# Patient Record
Sex: Female | Born: 1940 | Race: White | Hispanic: No | Marital: Married | Smoking: Former smoker
Health system: Southern US, Community
[De-identification: ages and names within clinical notes are randomized; demographics above are authoritative.]

## PROBLEM LIST (undated history)

## (undated) DIAGNOSIS — H269 Unspecified cataract: Secondary | ICD-10-CM

## (undated) DIAGNOSIS — Z8781 Personal history of (healed) traumatic fracture: Secondary | ICD-10-CM

## (undated) DIAGNOSIS — M1990 Unspecified osteoarthritis, unspecified site: Secondary | ICD-10-CM

## (undated) DIAGNOSIS — K219 Gastro-esophageal reflux disease without esophagitis: Secondary | ICD-10-CM

## (undated) DIAGNOSIS — R011 Cardiac murmur, unspecified: Secondary | ICD-10-CM

## (undated) HISTORY — DX: Unspecified cataract: H26.9

## (undated) HISTORY — DX: Cardiac murmur, unspecified: R01.1

## (undated) HISTORY — DX: Gastro-esophageal reflux disease without esophagitis: K21.9

## (undated) HISTORY — DX: Personal history of (healed) traumatic fracture: Z87.81

## (undated) HISTORY — DX: Unspecified osteoarthritis, unspecified site: M19.90

---

## 1977-04-04 HISTORY — PX: ABDOMINAL HYSTERECTOMY: SHX81

## 1998-05-27 ENCOUNTER — Other Ambulatory Visit: Admission: RE | Admit: 1998-05-27 | Discharge: 1998-05-27 | Payer: Self-pay | Admitting: Gynecology

## 1999-02-15 ENCOUNTER — Encounter: Payer: Self-pay | Admitting: Family Medicine

## 1999-02-15 ENCOUNTER — Encounter: Admission: RE | Admit: 1999-02-15 | Discharge: 1999-02-15 | Payer: Self-pay | Admitting: Family Medicine

## 1999-02-23 ENCOUNTER — Encounter: Payer: Self-pay | Admitting: Neurosurgery

## 1999-02-23 ENCOUNTER — Encounter: Admission: RE | Admit: 1999-02-23 | Discharge: 1999-02-23 | Payer: Self-pay | Admitting: Neurosurgery

## 1999-03-04 ENCOUNTER — Encounter: Payer: Self-pay | Admitting: Neurosurgery

## 1999-03-04 ENCOUNTER — Ambulatory Visit (HOSPITAL_COMMUNITY): Admission: RE | Admit: 1999-03-04 | Discharge: 1999-03-04 | Payer: Self-pay | Admitting: Neurosurgery

## 1999-03-25 ENCOUNTER — Encounter: Payer: Self-pay | Admitting: Neurosurgery

## 1999-03-25 ENCOUNTER — Ambulatory Visit (HOSPITAL_COMMUNITY): Admission: RE | Admit: 1999-03-25 | Discharge: 1999-03-25 | Payer: Self-pay | Admitting: Neurosurgery

## 1999-04-09 ENCOUNTER — Ambulatory Visit (HOSPITAL_COMMUNITY): Admission: RE | Admit: 1999-04-09 | Discharge: 1999-04-09 | Payer: Self-pay | Admitting: Neurosurgery

## 1999-04-09 ENCOUNTER — Encounter: Payer: Self-pay | Admitting: Neurosurgery

## 1999-06-07 ENCOUNTER — Other Ambulatory Visit: Admission: RE | Admit: 1999-06-07 | Discharge: 1999-06-07 | Payer: Self-pay | Admitting: Gynecology

## 2000-06-22 ENCOUNTER — Other Ambulatory Visit: Admission: RE | Admit: 2000-06-22 | Discharge: 2000-06-22 | Payer: Self-pay | Admitting: Gynecology

## 2001-06-13 ENCOUNTER — Other Ambulatory Visit: Admission: RE | Admit: 2001-06-13 | Discharge: 2001-06-13 | Payer: Self-pay | Admitting: Gynecology

## 2002-04-04 HISTORY — PX: CHOLECYSTECTOMY: SHX55

## 2002-06-12 ENCOUNTER — Other Ambulatory Visit: Admission: RE | Admit: 2002-06-12 | Discharge: 2002-06-12 | Payer: Self-pay | Admitting: Gynecology

## 2002-07-19 ENCOUNTER — Encounter (INDEPENDENT_AMBULATORY_CARE_PROVIDER_SITE_OTHER): Payer: Self-pay | Admitting: *Deleted

## 2002-07-19 ENCOUNTER — Encounter: Payer: Self-pay | Admitting: Emergency Medicine

## 2002-07-19 ENCOUNTER — Inpatient Hospital Stay (HOSPITAL_COMMUNITY): Admission: EM | Admit: 2002-07-19 | Discharge: 2002-07-22 | Payer: Self-pay | Admitting: Emergency Medicine

## 2002-07-19 ENCOUNTER — Encounter: Payer: Self-pay | Admitting: General Surgery

## 2002-07-20 ENCOUNTER — Encounter: Payer: Self-pay | Admitting: General Surgery

## 2002-07-20 ENCOUNTER — Encounter: Payer: Self-pay | Admitting: Gastroenterology

## 2002-09-11 ENCOUNTER — Ambulatory Visit (HOSPITAL_COMMUNITY): Admission: RE | Admit: 2002-09-11 | Discharge: 2002-09-11 | Payer: Self-pay | Admitting: Gastroenterology

## 2002-12-12 ENCOUNTER — Encounter: Payer: Self-pay | Admitting: Orthopedic Surgery

## 2002-12-12 ENCOUNTER — Ambulatory Visit (HOSPITAL_COMMUNITY): Admission: RE | Admit: 2002-12-12 | Discharge: 2002-12-12 | Payer: Self-pay | Admitting: Orthopedic Surgery

## 2002-12-25 ENCOUNTER — Ambulatory Visit (HOSPITAL_COMMUNITY): Admission: RE | Admit: 2002-12-25 | Discharge: 2002-12-25 | Payer: Self-pay | Admitting: Oncology

## 2002-12-25 ENCOUNTER — Encounter: Payer: Self-pay | Admitting: Oncology

## 2002-12-31 ENCOUNTER — Encounter: Payer: Self-pay | Admitting: Oncology

## 2002-12-31 ENCOUNTER — Ambulatory Visit (HOSPITAL_COMMUNITY): Admission: RE | Admit: 2002-12-31 | Discharge: 2002-12-31 | Payer: Self-pay | Admitting: Oncology

## 2003-01-03 ENCOUNTER — Encounter: Payer: Self-pay | Admitting: Oncology

## 2003-01-03 ENCOUNTER — Ambulatory Visit (HOSPITAL_COMMUNITY): Admission: RE | Admit: 2003-01-03 | Discharge: 2003-01-03 | Payer: Self-pay | Admitting: Oncology

## 2003-01-03 ENCOUNTER — Encounter (INDEPENDENT_AMBULATORY_CARE_PROVIDER_SITE_OTHER): Payer: Self-pay | Admitting: *Deleted

## 2003-01-03 ENCOUNTER — Encounter (INDEPENDENT_AMBULATORY_CARE_PROVIDER_SITE_OTHER): Payer: Self-pay | Admitting: Specialist

## 2003-01-31 ENCOUNTER — Ambulatory Visit (HOSPITAL_COMMUNITY): Admission: RE | Admit: 2003-01-31 | Discharge: 2003-01-31 | Payer: Self-pay | Admitting: Oncology

## 2003-02-10 ENCOUNTER — Encounter (INDEPENDENT_AMBULATORY_CARE_PROVIDER_SITE_OTHER): Payer: Self-pay | Admitting: *Deleted

## 2003-02-10 ENCOUNTER — Other Ambulatory Visit: Admission: RE | Admit: 2003-02-10 | Discharge: 2003-02-10 | Payer: Self-pay | Admitting: Oncology

## 2003-04-05 HISTORY — PX: COLONOSCOPY: SHX174

## 2003-04-14 ENCOUNTER — Ambulatory Visit (HOSPITAL_COMMUNITY): Admission: RE | Admit: 2003-04-14 | Discharge: 2003-04-14 | Payer: Self-pay | Admitting: Oncology

## 2003-07-01 ENCOUNTER — Other Ambulatory Visit: Admission: RE | Admit: 2003-07-01 | Discharge: 2003-07-01 | Payer: Self-pay | Admitting: Gynecology

## 2003-08-25 ENCOUNTER — Ambulatory Visit (HOSPITAL_COMMUNITY): Admission: RE | Admit: 2003-08-25 | Discharge: 2003-08-25 | Payer: Self-pay | Admitting: Oncology

## 2003-09-08 ENCOUNTER — Ambulatory Visit (HOSPITAL_COMMUNITY): Admission: RE | Admit: 2003-09-08 | Discharge: 2003-09-08 | Payer: Self-pay | Admitting: Oncology

## 2003-10-27 ENCOUNTER — Ambulatory Visit (HOSPITAL_COMMUNITY): Admission: RE | Admit: 2003-10-27 | Discharge: 2003-10-27 | Payer: Self-pay | Admitting: Gastroenterology

## 2003-10-27 ENCOUNTER — Encounter (INDEPENDENT_AMBULATORY_CARE_PROVIDER_SITE_OTHER): Payer: Self-pay | Admitting: *Deleted

## 2004-02-04 ENCOUNTER — Encounter: Admission: RE | Admit: 2004-02-04 | Discharge: 2004-02-04 | Payer: Self-pay | Admitting: General Surgery

## 2004-06-21 ENCOUNTER — Other Ambulatory Visit: Admission: RE | Admit: 2004-06-21 | Discharge: 2004-06-21 | Payer: Self-pay | Admitting: Gynecology

## 2004-07-07 ENCOUNTER — Encounter: Admission: RE | Admit: 2004-07-07 | Discharge: 2004-07-07 | Payer: Self-pay | Admitting: Gynecology

## 2005-07-08 ENCOUNTER — Encounter: Admission: RE | Admit: 2005-07-08 | Discharge: 2005-07-08 | Payer: Self-pay | Admitting: Internal Medicine

## 2005-07-20 ENCOUNTER — Other Ambulatory Visit: Admission: RE | Admit: 2005-07-20 | Discharge: 2005-07-20 | Payer: Self-pay | Admitting: Gynecology

## 2006-07-10 ENCOUNTER — Encounter: Admission: RE | Admit: 2006-07-10 | Discharge: 2006-07-10 | Payer: Self-pay | Admitting: Unknown Physician Specialty

## 2006-07-18 ENCOUNTER — Other Ambulatory Visit: Admission: RE | Admit: 2006-07-18 | Discharge: 2006-07-18 | Payer: Self-pay | Admitting: Gynecology

## 2009-04-04 DIAGNOSIS — Z8781 Personal history of (healed) traumatic fracture: Secondary | ICD-10-CM

## 2009-04-04 HISTORY — DX: Personal history of (healed) traumatic fracture: Z87.81

## 2010-02-19 ENCOUNTER — Inpatient Hospital Stay (HOSPITAL_COMMUNITY)
Admission: EM | Admit: 2010-02-19 | Discharge: 2010-02-23 | Payer: Self-pay | Source: Home / Self Care | Admitting: Emergency Medicine

## 2010-06-16 LAB — DIFFERENTIAL
Basophils Absolute: 0 10*3/uL (ref 0.0–0.1)
Basophils Relative: 0 % (ref 0–1)
Eosinophils Absolute: 0 10*3/uL (ref 0.0–0.7)
Eosinophils Relative: 0 % (ref 0–5)
Lymphocytes Relative: 4 % — ABNORMAL LOW (ref 12–46)
Lymphs Abs: 0.6 10*3/uL — ABNORMAL LOW (ref 0.7–4.0)
Monocytes Absolute: 0.9 10*3/uL (ref 0.1–1.0)
Monocytes Relative: 6 % (ref 3–12)
Neutro Abs: 13 10*3/uL — ABNORMAL HIGH (ref 1.7–7.7)
Neutrophils Relative %: 90 % — ABNORMAL HIGH (ref 43–77)

## 2010-06-16 LAB — CBC
HCT: 40.8 % (ref 36.0–46.0)
Hemoglobin: 13.7 g/dL (ref 12.0–15.0)
MCH: 30.9 pg (ref 26.0–34.0)
MCHC: 33.6 g/dL (ref 30.0–36.0)
MCV: 92.1 fL (ref 78.0–100.0)
Platelets: 185 10*3/uL (ref 150–400)
RBC: 4.43 MIL/uL (ref 3.87–5.11)
RDW: 13 % (ref 11.5–15.5)
WBC: 14.5 10*3/uL — ABNORMAL HIGH (ref 4.0–10.5)

## 2010-06-16 LAB — BASIC METABOLIC PANEL
BUN: 7 mg/dL (ref 6–23)
CO2: 28 mEq/L (ref 19–32)
Calcium: 9.1 mg/dL (ref 8.4–10.5)
Chloride: 105 mEq/L (ref 96–112)
Creatinine, Ser: 0.68 mg/dL (ref 0.4–1.2)
GFR calc Af Amer: >60 mL/min (ref >60)
GFR calc non Af Amer: >60 mL/min (ref >60)
Glucose, Bld: 142 mg/dL — ABNORMAL HIGH (ref 70–99)
Potassium: 3.9 mEq/L (ref 3.5–5.1)
Sodium: 140 mEq/L (ref 135–145)

## 2010-08-20 NOTE — Op Note (Signed)
NAME:  Ashlee Hamilton, Ashlee Hamilton                        ACCOUNT NO.:  [id number]   MEDICAL RECORD NO.:  [id number]                   PATIENT TYPE:  AMB   LOCATION:  ENDO                                 FACILITY:  [facility]   PHYSICIAN:  Anselmo Rod, M.D.               DATE OF BIRTH:  16-Jul-1940   DATE OF PROCEDURE:  10/27/2003  DATE OF DISCHARGE:                                 OPERATIVE REPORT   PROCEDURE PERFORMED:  Colonoscopy with snare polypectomy x1 and cold  biopsies x3.   ENDOSCOPIST:  Anselmo Rod, M.D.   INSTRUMENT USED:  Olympus video colonoscope.   INDICATION FOR PROCEDURE:  A 70 year old white female undergoing screening  colonoscopy.  Rule out colonic polyps, masses, etc.   PREPROCEDURE PREPARATION:  Informed consent was procured from the patient.  The patient was fasted for eight hours prior to the procedure and prepped  with a bottle of magnesium citrate and a gallon of GoLYTELY the night prior  to the procedure.   PREPROCEDURE PHYSICAL:  VITAL SIGNS:  The patient had stable vital signs.  NECK:  Supple.  CHEST:  Clear to auscultation.  S1, S2 regular.  ABDOMEN:  Soft with normal bowel sounds.   DESCRIPTION OF PROCEDURE:  The patient was placed in the left lateral  decubitus position and sedated with 80 mg of Demerol and 8 mg of Versed in  slow incremental doses.  Once the patient was adequately sedate and  maintained on low-flow oxygen and continuous cardiac monitoring, the Olympus  video colonoscope was advanced from the rectum to the cecum.  The  appendiceal orifice and the ileocecal valve were visualized and  photographed.  A small sessile polyp was snared from the rectum and three  small sessile polyps were biopsied from the rectum as well as small internal  hemorrhoids were seen on retroflexion.  One external hemorrhoids was seen on  anal inspection.  There was some residual stool in the colon.  Multiple  washes were done.   IMPRESSION:  1. Four polyps  removed from rectum, one by snare polypectomy and three by     cold biopsies.  2. Small external hemorrhoids.  3. Small internal hemorrhoids.  4. No evidence of diverticulosis.   RECOMMENDATIONS:  1. Await pathology results.  2. Avoid nonsteroidals, including aspirin, for now.  3. Outpatient follow-up in the next two weeks for further recommendations.                                               Anselmo Rod, M.D.    JNM/MEDQ  D:  10/27/2003  T:  10/27/2003  Job:  [id number]   cc:   Barry Dienes. Eloise Harman, M.D.  [address]  [place]  [place] [address]  Fax: [phone]

## 2010-08-20 NOTE — Discharge Summary (Signed)
   Ashlee Hamilton, Ashlee Hamilton                    ACCOUNT NO.:  [id number]   MEDICAL RECORD NO.:  [id number]                   PATIENT TYPE:  INP   LOCATION:  5016                                 FACILITY:  [facility]   PHYSICIAN:  Adolph Pollack, M.D.            DATE OF BIRTH:  1940-12-28   DATE OF ADMISSION:  07/19/2002  DATE OF DISCHARGE:  07/22/2002                                 DISCHARGE SUMMARY   PRINCIPAL DISCHARGE DIAGNOSIS:  1. Acute cholecystitis and cholelithiasis.  2. Status post cholecystectomy for bile leak from accessory duct of Luschka.  3. Bronchitis.   PROCEDURE:  1. Laparoscopic cholecystectomy with intraoperative cholangiogram July 19, 2002.  2. ERCP with stent placement July 20, 2002.   REASON FOR ADMISSION:  [name] is a 70 year old female admitted with  right upper pain and tenderness. She had some significant right upper  quadrant tenderness on examination. Her white blood cell count had a  leftward shift. Common bile duct was 7 to 8 mm. She subsequently was  admitted and taken to the operating room.   HOSPITAL COURSE:  She was admitted and underwent the above procedure and did  well until the morning of her first postoperative day where early on she  began having acute onset of sharp right flank pain and back pain. A stat  HIDA scan was performed, and empiric IV Unasyn was given. This showed  evidence of a bile leakage and underwent ERCP and immediately felt much  better.   She was also noted to have a left upper nodule on chest x-ray. She stated  that she had old x-rays, so we were going to retrieve these as an outpatient  and review that. By July 22, 2002, she was comfortable, tolerating a diet,  afebrile, feeling well, and she was subsequently discharged.    DISPOSITION:  Discharged home in satisfactory condition on July 22, 2002.  She will return to see Dr. Madilyn Fireman for stent removal. She will follow up with  me in the office in about two  weeks. Instruction sheet has been given; it  also has activity restrictions for her. She is discharged in satisfactory  condition.                                               Adolph Pollack, M.D.    Kari Baars  D:  09/23/2002  T:  09/24/2002  Job:  [id number]   cc:   Everardo All. Madilyn Fireman, M.D.  [address], [address]  [place]  [place] [address]  Fax: [phone]    cc:   Everardo All. Madilyn Fireman, M.D.  [address], [address]  [place]  [place] [address]  Fax: [phone]

## 2010-08-20 NOTE — Op Note (Signed)
NAME:  Ashlee Hamilton, Ashlee Hamilton                    ACCOUNT NO.:  [id number]   MEDICAL RECORD NO.:  [id number]                   PATIENT TYPE:  INP   LOCATION:  1823                                 FACILITY:  [facility]   PHYSICIAN:  Adolph Pollack, M.D.            DATE OF BIRTH:  [birth year]   DATE OF PROCEDURE:  07/19/2002  DATE OF DISCHARGE:                                 OPERATIVE REPORT   PREOPERATIVE DIAGNOSIS:  Acute cholecystitis.   POSTOPERATIVE DIAGNOSIS:  Acute cholecystitis.   PROCEDURE:  Laparoscopic cholecystectomy with intraoperative cholangiogram.   SURGEON:  Adolph Pollack, M.D.   ASSISTANT:  Magnus Ivan, R.N.F.A.   ANESTHESIA:  General.   INDICATIONS:  The patient is a 70 year old female with persistent epigastric  abdominal pain, tenderness, and elevation of white blood cell count, also  has ultrasound findings consistent with acute cholecystitis.  Is now brought  to the operating room for urgent cholecystectomy.  The procedure and the  risks were explained to her preoperatively.   DESCRIPTION OF PROCEDURE:  She was seen in the holding area and brought to  the operating room, placed supine on the operating table, and a general  anesthetic was administered.  Her abdomen was sterilely prepped and draped.  Marcaine 0.5% was infiltrated in the subumbilical region and a small  subumbilical incision made, incising the skin and subcutaneous tissue and  midline fascia.  The peritoneal cavity was then entered bluntly and under  direct vision.  A pursestring suture of 0 Vicryl was placed around the  fascial edges.  A Hasson trocar was introduced into the peritoneal cavity  and pneumoperitoneum created by insufflation of CO2 gas.  Next the  laparoscope was introduced.  No underlying visceral injury was anted  directly below the trocar site.   She was then placed in the reversed Trendelenburg position with the right  side slightly tilted up.  An 11 mm incision was  made through an epigastric  incision and two 5 mm incisions made in the right midabdomen.  The  gallbladder was visualized and was acutely inflamed and slightly distended.  The fundus of the gallbladder was able to be grasped and retracted toward  the right shoulder.  There were stones impacted in the gallbladder neck.  The area above the neck was grasped and retracted laterally.  Using careful  blunt dissection and select cautery, the infundibulum was mobilized.  The  cystic duct was identified and a window created around it.  A clip was  placed just above the cystic duct-gallbladder junction and a small incision  made at the cystic duct-gallbladder junction.  A cholangiocatheter was  passed through the anterior wall and placed into the cystic duct and a  cholangiogram was performed.   Under real-time fluoroscopy and using dilute contrast material,  cholangiography was performed.  The contrast was injected into the cystic  duct.  The common hepatic, right and left hepatic, and common bile ducts  all  filled.  The common bile duct drained into the duodenum.  I did not see any  obvious evidence of obstruction.  The final report is pending the  radiologist's interpretation.   The cholangiocatheter was removed.  The cystic duct was then clipped three  times proximally and divided.  Using careful blunt dissection, I dissected  the node of Calot free from an anterior branch of the cystic artery.  I  created a window around this anterior branch, clipped it and divided it.  The posterior branch was also identified and a window created around it and  clipped and divided close to the gallbladder.  The gallbladder was then  dissected free from the liver bed using electrocautery and placed in an  Endopouch bag.   I then copiously irrigated out the gallbladder fossa.  I noted no bleeding  or bile leakage.  I then removed the gallbladder in the Endopouch bag  through the subumbilical incision and  closed the defect under laparoscopic  vision by tightening up and tying down the pursestring suture.  I  reinspected the gallbladder fossa and again noted no bleeding or leakage of  bile.  I removed the trocars and released the pneumoperitoneum after  evacuating as much of the irrigation fluid as possible.  I then closed the  skin incisions with 4-0 Monocryl subcuticular stitches.  Steri-Strips and  then sterile dressings were applied.   She tolerated the procedure well without any apparent complications and was  taken to the recovery room in satisfactory condition.                                               Adolph Pollack, M.D.    Kari Baars  D:  07/19/2002  T:  07/20/2002  Job:  [id number]   cc:   Leatha Gilding. Mezer, M.D.  [address]  [place]  [place] [address]  Fax: [phone]

## 2010-08-20 NOTE — Op Note (Signed)
   NAME:  Ashlee Hamilton, Ashlee Hamilton                    ACCOUNT NO.:  [id number]   MEDICAL RECORD NO.:  [id number]                   PATIENT TYPE:  AMB   LOCATION:  ENDO                                 [facility]ACILITY:  [facility]   PHYSICIAN:  John C. Madilyn Fireman, M.D.                 DATE OF BIRTH:  03-01-1941   DATE OF PROCEDURE:  09/11/2002  DATE OF DISCHARGE:                                 OPERATIVE REPORT   PROCEDURE:  Esophagogastroduodenoscopy with stent removal.   INDICATIONS FOR PROCEDURE:  A patient with a previously placed biliary stent  due to a bile leak identified after laparoscopic cholecystectomy. The  patient has improved clinically with normalization of liver function tests,  resolution of pain and procedure is requested to remove the stent.   DESCRIPTION OF PROCEDURE:  The patient was placed in the left lateral  decubitus position then placed on the pulse monitor with continuous low flow  oxygen delivered by nasal cannula. She was sedated with 75 mcg IV fentanyl  and 6 mg IV Versed. The Olympus side viewing endoscope was advanced blindly  into the oropharynx, esophagus, and stomach. The stomach appeared grossly  normal with no abnormalities and the pylorus was identified and traversed  with the levator located on the medial duodenal wall. The previously placed  stent appeared to be in good position and was draining bile. It was grasped  with the snare loop and pulled out through the mouth with the scope. The  patient was then returned to the recovery room in stable condition. She  tolerated the procedure well and there were no immediate complications.   IMPRESSION:  Replacement of previously placed biliary stent endoscopically.                                               John C. Madilyn Fireman, M.D.    JCH/MEDQ  D:  09/11/2002  T:  09/11/2002  Job:  [id number]   cc:   Adolph Pollack, M.D.  [address], [address]  [place]  [place] [address]  Fax: [phone]

## 2010-08-20 NOTE — Op Note (Signed)
NAME:  Ashlee Hamilton, Ashlee Hamilton                    ACCOUNT NO.:  [id number]   MEDICAL RECORD NO.:  [id number]                   PATIENT TYPE:  INP   LOCATION:  5016                                 FACILITY:  [facility]   PHYSICIAN:  John C. Madilyn Fireman, M.D.                 DATE OF BIRTH:  01-13-1941   DATE OF PROCEDURE:  07/20/2002  DATE OF DISCHARGE:                                 OPERATIVE REPORT   PROCEDURE:  Endoscopic retrograde cholangiopancreatography with stent  placement.   INDICATIONS:  The patient is status post laparoscopic cholecystectomy  yesterday with abrupt onset of abdominal pain this morning and PIPIDA scan  showing bile leak.   DESCRIPTION OF PROCEDURE:  The patient was placed in the prone position and  placed on the pulse monitor with continuous low flow oxygen delivered by  nasal cannula.  Informed consent was obtained prior to the procedure.  Risks  including a 5% chance of pancreatitis were explained to the patient.  She  wished to proceed.  The Olympus video side-viewing endoscope was advanced blindly into the lower  pharynx, esophagus and stomach.  There was a mild amount of bilious fluid in  the stomach that was suctioned.  Otherwise, the fundus, body, antrum, and  pylorus all appeared normal.  The duodenum was entered and the papilla of  Vater located on the medial duodenal wall.  It had a fairly normal  appearance and was cannulated with shallow cannulation with the Wilson-Cook  sphincterotome and with passage of the Jag wire, we were able to achieve  selective cannulation of the common bile duct.  A cholangiogram was  obtained, which identified the cystic duct and some intrahepatic bile duct  as well as the common bile and common hepatic duct.  No obvious filling  defects were seen and no obvious extravasation was seen with two syringefuls  of contrast injected.  Based on a positive PIPIDA scan, I elected to go  ahead and proceed with placement of biliary stent  using the Oasis system.  A  5 cm 10-French plastic stent was placed through the papilla with good final  position and good drainage.  The scope was then withdrawn and the patient  returned to the recovery room in stable condition.  She tolerated the  procedure well and there were no immediate complications.   IMPRESSION:  No biliary leak identified by current cholangiogram with leak  inferred from PIPIDA scan, status post biliary stent placement.   PLAN:  Will follow clinically for resolution of symptoms and leave stent in  for at least a month or two.                                               John C. Madilyn Fireman, M.D.    JCH/MEDQ  D:  07/20/2002  T:  07/21/2002  Job:  [id number]   cc:   Adolph Pollack, M.D.  [address]  [place]  [place] [address]  Fax: [phone]

## 2010-08-20 NOTE — H&P (Signed)
NAME:  Ashlee Hamilton, Ashlee Hamilton                    ACCOUNT NO.:  [id number]   MEDICAL RECORD NO.:  [id number]                   PATIENT TYPE:  EMS   LOCATION:  MAJO                                 FACILITY:  [facility]   PHYSICIAN:  Adolph Pollack, M.D.            DATE OF BIRTH:  [birth year]   DATE OF ADMISSION:  07/19/2002  DATE OF DISCHARGE:                                HISTORY & PHYSICAL   CHIEF COMPLAINT:  Epigastric abdominal pain.   HISTORY OF PRESENT ILLNESS:  The patient is a 70 year old female who ate a  steak and cheese sandwich last night and had a fairly significant attack of  sharp epigastric pain that radiated through the back, associated with nausea  and vomiting.  The pain has continued without relief.  She presented to the  emergency department for evaluation.  No fever or chills.  No diarrhea.  She  has had other episodes like this but none as severe and most of them have  been self-limited.  Nothing that she has tried has been able to relieve it.   PAST MEDICAL HISTORY:  1. Herniated lumbar disc.  2. Hormonal deficiency.   PAST SURGICAL HISTORY:  Partial hysterectomy.   ALLERGIES:  No known drug allergies.   MEDICATION:  Premarin.   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  She smokes a pack  of cigarettes a day and has been doing so for some 28 years.  There is only  occasional alcohol use.  No drug abuse.   FAMILY HISTORY:  Father died of prostate cancer.  Brother died from  metastatic cancer, type unknown to her.  She does have brother and sister  with hypertension and hypercholesterolemia.   REVIEW OF SYSTEMS:  She states she has had about a 15 pound weight loss over  the past year although she has not had a change in appetite or food intake.  She has just attributed it to the fact that she had been doing a lot of  painting and it was hot last summer.  No change in her energy level.  CARDIOVASCULAR:  She reports having a heart murmur in  the past.  No known  coronary artery disease, no hypertension.  PULMONARY:  She does get  bronchitis intermittently.  No pneumonia or asthma.  No known COPD.  GI:  No  peptic ulcer disease, hepatitis, diverticulitis.  She does have some  intermittent constipation.  GU:  No kidney stones. ENDOCRINE:  No thyroid  disease or diabetes.  NEUROLOGIC:  No strokes or seizures.  HEMATOLOGIC:  No  bleeding disorders, transfusions or venous thrombosis.   PHYSICAL EXAMINATION:  GENERAL APPEARANCE:  She is a thin female in no acute  distress, fairly pleasant and cooperative.  She was much more comfortable  after a Dilaudid shot.  HEENT:  Extraocular movements intact.  No icterus noted.  NECK:  Supple without palpable masses or obvious thyroid enlargement.  RESPIRATORY:  Breath sounds are equal with a soft wheeze bilaterally.  Respirations are not labored.  CARDIOVASCULAR:  Heart demonstrates regular rate and rhythm and I do not  appreciate a murmur.  ABDOMEN:  Soft with right upper quadrant epigastric tenderness and guarding  to palpation and percussion.  No mass noted.  No hernias noted.  RECTAL:  Normal tone, no masses, stool is dark brown and has been sent for  occult blood testing.  EXTREMITIES:  Full range of motion, no cyanosis or edema.  NEUROLOGIC:  She is alert and oriented and has 5/5 motor strength.   LABORATORY DATA:  Her liver function tests are all within normal limits.  Potassium is 3.4, sodium 133, glucose 120.  Lipase  26.  White blood cell  count 10,900 with leftward sift, hemoglobin 15.2, platelet count 151,000.   Abdominal ultrasound demonstrates cholelithiasis with gallstones fixed at  the gallbladder neck.  There is a sonographic Murphy's sign noted.  Common  bile duct was 7 to 8 mm, thought to be slightly dilated.   IMPRESSION:  1. Acute cholecystitis by history and examination as well as ultrasound.  2. Mild hypokalemia and hyponatremia.  3. A 15 pound weight loss over a  year, reason unknown.   PLAN:  Admission to hospital for IV antibiotics and laparoscopic, possible  open, cholecystectomy.  I discussed the procedure rational and risks with  her.  The risks include, but are not limited to, bleeding, infection, common  bile duct injury, hepatic injury or bile leak, small intestinal injury,  risks of general anesthesia, and post cholecystectomy diarrhea.  We also  talked about the fact that she did have common bile duct stones and she may  need ERCP.  Both she and her husband seem to understand and agree to  proceed.                                               Adolph Pollack, M.D.    Kari Baars  D:  07/19/2002  T:  07/19/2002  Job:  [id number]

## 2011-05-30 ENCOUNTER — Encounter: Payer: Self-pay | Admitting: Family Medicine

## 2011-05-30 NOTE — Telephone Encounter (Signed)
Error

## 2011-06-29 NOTE — Telephone Encounter (Signed)
This encounter was created in error - please disregard.

## 2012-07-18 ENCOUNTER — Encounter: Payer: Self-pay | Admitting: Internal Medicine

## 2012-07-18 ENCOUNTER — Other Ambulatory Visit (INDEPENDENT_AMBULATORY_CARE_PROVIDER_SITE_OTHER): Payer: Medicare Other

## 2012-07-18 ENCOUNTER — Ambulatory Visit (INDEPENDENT_AMBULATORY_CARE_PROVIDER_SITE_OTHER): Payer: Medicare Other | Admitting: Internal Medicine

## 2012-07-18 VITALS — BP 142/78 | HR 55 | Temp 97.9°F | Ht 68.0 in | Wt 192.5 lb

## 2012-07-18 DIAGNOSIS — M8580 Other specified disorders of bone density and structure, unspecified site: Secondary | ICD-10-CM

## 2012-07-18 DIAGNOSIS — Z23 Encounter for immunization: Secondary | ICD-10-CM

## 2012-07-18 DIAGNOSIS — M899 Disorder of bone, unspecified: Secondary | ICD-10-CM

## 2012-07-18 DIAGNOSIS — R030 Elevated blood-pressure reading, without diagnosis of hypertension: Secondary | ICD-10-CM

## 2012-07-18 DIAGNOSIS — Z136 Encounter for screening for cardiovascular disorders: Secondary | ICD-10-CM

## 2012-07-18 DIAGNOSIS — Z13220 Encounter for screening for lipoid disorders: Secondary | ICD-10-CM

## 2012-07-18 LAB — LIPID PANEL
Cholesterol: 243 mg/dL — ABNORMAL HIGH (ref 0–200)
HDL: 54.2 mg/dL (ref >39.00)
VLDL: 15.4 mg/dL (ref 0.0–40.0)

## 2012-07-18 LAB — CBC
MCHC: 33.2 g/dL (ref 30.0–36.0)
MCV: 90.5 fl (ref 78.0–100.0)
RBC: 4.74 Mil/uL (ref 3.87–5.11)

## 2012-07-18 LAB — BASIC METABOLIC PANEL
GFR: 86.6 mL/min (ref >60.00)
Glucose, Bld: 91 mg/dL (ref 70–99)
Potassium: 4.3 mEq/L (ref 3.5–5.1)
Sodium: 139 mEq/L (ref 135–145)

## 2012-07-18 NOTE — Patient Instructions (Signed)

## 2012-07-18 NOTE — Progress Notes (Addendum)
HPI  Pt presents to the clinic today to establish care. She has not seen a PCP in 6 years. She has no concerns today.  Flu: never Tetanus: more than 10 years Pneumovax :never Zostovax: never Mammogram: 2009 Pap smear: 2008 Colonoscopy: 2005 (10 years) Eye doctor: as needed Dentist: as needed  Past Medical History  Diagnosis Date  . History of fractured rib 2011    4   . Arthritis   . Heart murmur     Current Outpatient Prescriptions  Medication Sig Dispense Refill  . Calcium Carbonate-Vit D-Min (EQ CALCIUM 600+D+MINERALS PO) Take 1 tablet by mouth daily.      . Multiple Vitamin (MULTIVITAMIN WITH MINERALS) TABS Take 2 tablets by mouth daily.       No current facility-administered medications for this visit.    No Known Allergies  Family History  Problem Relation Age of Onset  . Prostate cancer Father   . Breast cancer Sister   . Hypertension Sister   . Diabetes Neg Hx   . Stroke Neg Hx     History   Social History  . Marital Status: Married    Spouse Name: N/A    Number of Children: N/A  . Years of Education: 12   Occupational History  . Retired    Social History Main Topics  . Smoking status: Former Games developer  . Smokeless tobacco: Never Used  . Alcohol Use: No  . Drug Use: No  . Sexually Active: Not Currently   Other Topics Concern  . Not on file   Social History Narrative   Regular exercise-no   Caffeine Use-no    ROS:  Constitutional: Denies fever, malaise, fatigue, headache or abrupt weight changes.  HEENT: Denies eye pain, eye redness, ear pain, ringing in the ears, wax buildup, runny nose, nasal congestion, bloody nose, or sore throat. Respiratory: Denies difficulty breathing, shortness of breath, cough or sputum production.   Cardiovascular: Denies chest pain, chest tightness, palpitations or swelling in the hands or feet.  Gastrointestinal: Denies abdominal pain, bloating, constipation, diarrhea or blood in the stool.  GU: Denies  frequency, urgency, pain with urination, blood in urine, odor or discharge. Musculoskeletal: Pt reports intermittent arthritis. Denies decrease in range of motion, difficulty with gait, muscle pain or joint pain and swelling.  Skin: Denies redness, rashes, lesions or ulcercations.  Neurological: Denies dizziness, difficulty with memory, difficulty with speech or problems with balance and coordination.   No other specific complaints in a complete review of systems (except as listed in HPI above).  PE:  BP 142/78  Pulse 55  Temp(Src) 97.9 F (36.6 C) (Oral)  Ht 5\' 8"  (1.727 m)  Wt 192 lb 8 oz (87.317 kg)  BMI 29.28 kg/m2  SpO2 95% Wt Readings from Last 3 Encounters:  07/18/12 192 lb 8 oz (87.317 kg)    General: Appears her stated age, overweight but well developed, well nourished in NAD. HEENT: Head: normal shape and size; Eyes: sclera white, no icterus, conjunctiva pink, PERRLA and EOMs intact; Ears: Tm's gray and intact, normal light reflex; Nose: mucosa pink and moist, septum midline; Throat/Mouth: Teeth present, mucosa pink and moist, no lesions or ulcerations noted.  Neck: Normal range of motion. Neck supple, trachea midline. No massses, lumps or thyromegaly present.  Cardiovascular: Normal rate and rhythm. S1,S2 noted.  Murmur noted. No rubs or gallops noted. No JVD or BLE edema. No carotid bruits noted. Pulmonary/Chest: Normal effort and positive vesicular breath sounds. No respiratory distress. No wheezes,  rales or ronchi noted.  Abdomen: Soft and nontender. Normal bowel sounds, no bruits noted. No distention or masses noted. Liver, spleen and kidneys non palpable. Musculoskeletal: Normal range of motion. No signs of joint swelling. No difficulty with gait.  Neurological: Alert and oriented. Cranial nerves II-XII intact. Coordination normal. +DTRs bilaterally. Psychiatric: Mood and affect normal. Behavior is normal. Judgment and thought content normal.      Assessment and  Plan:  Health maintenance:  Pt declines to continue mammogram and pap smear Encouraged pt to start a diet and exercise program Pt will need repeat colo next year Pt does have elevated blood pressure- will obtain some lab work Will screen for hyperlipidemia Pt declines bone scan Tdap given today

## 2012-07-18 NOTE — Addendum Note (Signed)
Addended by: Carin Primrose on: 07/18/2012 04:12 PM   Modules accepted: Orders

## 2012-07-23 ENCOUNTER — Telehealth: Payer: Self-pay | Admitting: *Deleted

## 2012-07-23 ENCOUNTER — Encounter: Payer: Self-pay | Admitting: *Deleted

## 2012-07-23 NOTE — Telephone Encounter (Signed)
Pt informed of NP's advisement and also advised to call insurance to see what co-pay of shingles vaccine would be.

## 2012-07-23 NOTE — Telephone Encounter (Signed)
Ash, Yes I would recommend that she gets both. If she wants to get them, she can make a nurse visit for the injections. Rene Kocher

## 2012-07-23 NOTE — Telephone Encounter (Signed)
Pt wants to know if NP recommends she get the shingles and pneumovax vaccines. Pt states that she did have chicken pox as a child.

## 2012-07-24 ENCOUNTER — Ambulatory Visit (INDEPENDENT_AMBULATORY_CARE_PROVIDER_SITE_OTHER): Payer: Medicare Other | Admitting: *Deleted

## 2012-07-24 DIAGNOSIS — Z23 Encounter for immunization: Secondary | ICD-10-CM

## 2012-08-21 ENCOUNTER — Ambulatory Visit (INDEPENDENT_AMBULATORY_CARE_PROVIDER_SITE_OTHER): Payer: Medicare Other | Admitting: *Deleted

## 2012-08-21 DIAGNOSIS — Z2911 Encounter for prophylactic immunotherapy for respiratory syncytial virus (RSV): Secondary | ICD-10-CM

## 2012-08-21 DIAGNOSIS — Z23 Encounter for immunization: Secondary | ICD-10-CM

## 2012-09-10 ENCOUNTER — Ambulatory Visit (INDEPENDENT_AMBULATORY_CARE_PROVIDER_SITE_OTHER): Payer: Medicare Other | Admitting: Internal Medicine

## 2012-09-10 ENCOUNTER — Encounter: Payer: Self-pay | Admitting: Internal Medicine

## 2012-09-10 ENCOUNTER — Telehealth: Payer: Self-pay | Admitting: *Deleted

## 2012-09-10 VITALS — BP 132/80 | HR 63 | Temp 97.9°F

## 2012-09-10 DIAGNOSIS — L309 Dermatitis, unspecified: Secondary | ICD-10-CM

## 2012-09-10 DIAGNOSIS — L259 Unspecified contact dermatitis, unspecified cause: Secondary | ICD-10-CM

## 2012-09-10 MED ORDER — METHYLPREDNISOLONE ACETATE 80 MG/ML IJ SUSP
80.0000 mg | Freq: Once | INTRAMUSCULAR | Status: AC
  Administered 2012-09-10: 80 mg via INTRAMUSCULAR

## 2012-09-10 MED ORDER — TRIAMCINOLONE ACETONIDE 0.1 % EX CREA: TOPICAL_CREAM | Freq: Two times a day (BID) | CUTANEOUS | Status: DC

## 2012-09-10 NOTE — Telephone Encounter (Signed)
Left msg on triage have a red rash on her neck. It is spreading & itching. Called pt back made appt to see regina this afternoon...lmb

## 2012-09-10 NOTE — Progress Notes (Signed)
  Subjective:    Patient ID: Ashlee Hamilton, female    DOB: [birth year], 72 y.o.   MRN: [id number]  HPI  Pt presents to the clinic today with c/o a rash. She has had this rash on her neck prior. She reports that it is worse when she is stressed out. She is taking care of her husband who has dementia. It is very itchy. She has put neosporin on it without relief. She does report that she has very dry skin. She does not put any lotion on it. She has not been outside or near anything that would have caused this rash.  Review of Systems  Past Medical History  Diagnosis Date  . History of fractured rib 2011    4   . Arthritis   . Heart murmur     Current Outpatient Prescriptions  Medication Sig Dispense Refill  . Calcium Carbonate-Vit D-Min (EQ CALCIUM 600+D+MINERALS PO) Take 1 tablet by mouth daily.      . Multiple Vitamin (MULTIVITAMIN WITH MINERALS) TABS Take 2 tablets by mouth daily.      Marland Kitchen triamcinolone cream (KENALOG) 0.1 % Apply topically 2 (two) times daily.  30 g  0   No current facility-administered medications for this visit.    No Known Allergies  Family History  Problem Relation Age of Onset  . Prostate cancer Father   . Breast cancer Sister   . Hypertension Sister   . Diabetes Neg Hx   . Stroke Neg Hx     History   Social History  . Marital Status: Married    Spouse Name: N/A    Number of Children: N/A  . Years of Education: 12   Occupational History  . Retired    Social History Main Topics  . Smoking status: Former Games developer  . Smokeless tobacco: Never Used  . Alcohol Use: No  . Drug Use: No  . Sexually Active: Not Currently   Other Topics Concern  . Not on file   Social History Narrative   Regular exercise-no   Caffeine Use-no     Constitutional: Denies fever, malaise, fatigue, headache or abrupt weight changes.   Skin: Pt report rash. Denies redness, lesions or ulcercations.    No other specific complaints in a complete review of systems  (except as listed in HPI above).     Objective:   Physical Exam   BP 132/80  Pulse 63  Temp(Src) 97.9 F (36.6 C) (Oral)  SpO2 94% Wt Readings from Last 3 Encounters:  07/18/12 192 lb 8 oz (87.317 kg)    General: Appears her stated age, well developed, well nourished in NAD. Skin: Warm, dry and intact. Eczema noted on neck, very inflamed secondary to scratching. No lesions or ulcerations noted. Cardiovascular: Normal rate and rhythm. S1,S2 noted.  No murmur, rubs or gallops noted. No JVD or BLE edema. No carotid bruits noted. Pulmonary/Chest: Normal effort and positive vesicular breath sounds. No respiratory distress. No wheezes, rales or ronchi noted.        Assessment & Plan:   Eczematous dermatitis, recurrent:  eRx for triamcinolone cream 80 mg Depo IM  Put a very moisturizing lotion on your neck such as cetaphil, aveeno or eucerin Avoid hot water

## 2012-09-10 NOTE — Patient Instructions (Signed)

## 2012-10-23 ENCOUNTER — Telehealth: Payer: Self-pay | Admitting: *Deleted

## 2012-10-23 ENCOUNTER — Encounter: Payer: Self-pay | Admitting: Internal Medicine

## 2012-10-23 ENCOUNTER — Ambulatory Visit (INDEPENDENT_AMBULATORY_CARE_PROVIDER_SITE_OTHER): Payer: Medicare Other | Admitting: Internal Medicine

## 2012-10-23 ENCOUNTER — Other Ambulatory Visit (INDEPENDENT_AMBULATORY_CARE_PROVIDER_SITE_OTHER): Payer: Medicare Other

## 2012-10-23 VITALS — BP 130/70 | HR 54 | Temp 98.0°F | Wt 192.0 lb

## 2012-10-23 DIAGNOSIS — E785 Hyperlipidemia, unspecified: Secondary | ICD-10-CM

## 2012-10-23 LAB — LIPID PANEL
Cholesterol: 199 mg/dL (ref 0–200)
HDL: 57.6 mg/dL
LDL Cholesterol: 129 mg/dL — ABNORMAL HIGH (ref 0–99)
Total CHOL/HDL Ratio: 3
Triglycerides: 63 mg/dL (ref 0.0–149.0)
VLDL: 12.6 mg/dL (ref 0.0–40.0)

## 2012-10-23 NOTE — Patient Instructions (Signed)

## 2012-10-23 NOTE — Assessment & Plan Note (Signed)
Will recheck lipid profile today Continue to work on diet and exercise. 

## 2012-10-23 NOTE — Progress Notes (Signed)
Subjective:    Patient ID: Ashlee Hamilton, female    DOB: 06/17/1940, 72 y.o.   MRN: [id number]  HPI  Pt presents to the clinic today for 3 month f/u of Hyperlipidemia. At her last visit, her total cholesterol was 248, triglycerides 77, HDL 58 and LDL 168. She has tried to avoid fried and fatty foods. She has not really exercised any. She has tried to eat out less.   Review of Systems      Past Medical History  Diagnosis Date  . History of fractured rib 2011    4   . Arthritis   . Heart murmur     Current Outpatient Prescriptions  Medication Sig Dispense Refill  . Calcium Carbonate-Vit D-Min (EQ CALCIUM 600+D+MINERALS PO) Take 1 tablet by mouth daily.      . Multiple Vitamin (MULTIVITAMIN WITH MINERALS) TABS Take 2 tablets by mouth daily.      Marland Kitchen triamcinolone cream (KENALOG) 0.1 % Apply topically 2 (two) times daily.  30 g  0   No current facility-administered medications for this visit.    No Known Allergies  Family History  Problem Relation Age of Onset  . Prostate cancer Father   . Breast cancer Sister   . Hypertension Sister   . Diabetes Neg Hx   . Stroke Neg Hx     History   Social History  . Marital Status: Married    Spouse Name: N/A    Number of Children: N/A  . Years of Education: 12   Occupational History  . Retired    Social History Main Topics  . Smoking status: Former Games developer  . Smokeless tobacco: Never Used  . Alcohol Use: No  . Drug Use: No  . Sexually Active: Not Currently   Other Topics Concern  . Not on file   Social History Narrative   Regular exercise-no   Caffeine Use-no     Constitutional: Denies fever, malaise, fatigue, headache or abrupt weight changes.  Respiratory: Denies difficulty breathing, shortness of breath, cough or sputum production.   Cardiovascular: Denies chest pain, chest tightness, palpitations or swelling in the hands or feet.  Neurological: Denies dizziness, difficulty with memory, difficulty with speech  or problems with balance and coordination.   No other specific complaints in a complete review of systems (except as listed in HPI above).  Objective:   Physical Exam  BP 130/70  Pulse 54  Temp(Src) 98 F (36.7 C) (Oral)  Wt 192 lb (87.091 kg)  BMI 29.2 kg/m2  SpO2 95% Wt Readings from Last 3 Encounters:  10/23/12 192 lb (87.091 kg)  07/18/12 192 lb 8 oz (87.317 kg)    General: Appears her stated age, well developed, well nourished in NAD. Cardiovascular: Normal rate and rhythm. S1,S2 noted.  No murmur, rubs or gallops noted. No JVD or BLE edema. No carotid bruits noted. Pulmonary/Chest: Normal effort and positive vesicular breath sounds. No respiratory distress. No wheezes, rales or ronchi noted.  Neurological: Alert and oriented. Cranial nerves II-XII intact. Coordination normal. +DTRs bilaterally.   BMET    Component Value Date/Time   NA 139 07/18/2012 1523   K 4.3 07/18/2012 1523   CL 101 07/18/2012 1523   CO2 30 07/18/2012 1523   GLUCOSE 91 07/18/2012 1523   BUN 9 07/18/2012 1523   CREATININE 0.7 07/18/2012 1523   CALCIUM 9.9 07/18/2012 1523   GFRNONAA >60 02/19/2010 1736   GFRAA  Value: >60        The  eGFR has been calculated using the MDRD equation. This calculation has not been validated in all clinical situations. eGFR's persistently <60 mL/min signify possible Chronic Kidney Disease. 02/19/2010 1736    Lipid Panel     Component Value Date/Time   CHOL 243* 07/18/2012 1523   TRIG 77.0 07/18/2012 1523   HDL 54.20 07/18/2012 1523   CHOLHDL 4 07/18/2012 1523   VLDL 15.4 07/18/2012 1523    CBC    Component Value Date/Time   WBC 6.4 07/18/2012 1523   RBC 4.74 07/18/2012 1523   HGB 14.2 07/18/2012 1523   HCT 42.8 07/18/2012 1523   PLT 184.0 07/18/2012 1523   MCV 90.5 07/18/2012 1523   MCH 30.9 02/19/2010 1736   MCHC 33.2 07/18/2012 1523   RDW 13.9 07/18/2012 1523   LYMPHSABS 0.6* 02/19/2010 1736   MONOABS 0.9 02/19/2010 1736   EOSABS 0.0 02/19/2010 1736   BASOSABS 0.0  02/19/2010 1736    Hgb A1C No results found for this basename: HGBA1C         Assessment & Plan:

## 2012-10-23 NOTE — Telephone Encounter (Signed)
Spoke with pt, advised her of Regina's result note.

## 2013-02-11 ENCOUNTER — Telehealth: Payer: Self-pay

## 2013-02-11 NOTE — Telephone Encounter (Signed)
Left message reminding patient to have her mammogram done.

## 2013-03-04 ENCOUNTER — Telehealth: Payer: Self-pay

## 2013-03-04 NOTE — Telephone Encounter (Signed)
Called patient regarding mammogram.  She says she does not have the money right now to pay for this service.  Advised she may want to call just to see if she has a copay or not.  Patient agreed.

## 2013-11-19 ENCOUNTER — Encounter (HOSPITAL_COMMUNITY): Payer: Self-pay | Admitting: Emergency Medicine

## 2013-11-19 ENCOUNTER — Emergency Department (HOSPITAL_COMMUNITY)
Admission: EM | Admit: 2013-11-19 | Discharge: 2013-11-19 | Disposition: A | Discharge status: Home / Self Care | Payer: Medicare Other | Source: Home / Self Care

## 2013-11-19 DIAGNOSIS — L255 Unspecified contact dermatitis due to plants, except food: Secondary | ICD-10-CM

## 2013-11-19 MED ORDER — METHYLPREDNISOLONE SODIUM SUCC 125 MG IJ SOLR
80.0000 mg | Freq: Once | INTRAMUSCULAR | Status: AC
  Administered 2013-11-19: 80 mg via INTRAMUSCULAR

## 2013-11-19 MED ORDER — TRIAMCINOLONE ACETONIDE 0.1 % EX CREA
1.0000 | TOPICAL_CREAM | Freq: Two times a day (BID) | CUTANEOUS | Status: DC
Start: 2013-11-19 — End: 2015-09-11

## 2013-11-19 MED ORDER — METHYLPREDNISOLONE 4 MG PO KIT
PACK | ORAL | Status: DC
Start: 2013-11-19 — End: 2015-09-11

## 2013-11-19 MED ORDER — METHYLPREDNISOLONE SODIUM SUCC 125 MG IJ SOLR
INTRAMUSCULAR | Status: AC
  Filled 2013-11-19: qty 2

## 2013-11-19 NOTE — Discharge Instructions (Signed)
Contact Dermatitis Contact dermatitis is a reaction to certain substances that touch the skin. Contact dermatitis can be either irritant contact dermatitis or allergic contact dermatitis. Irritant contact dermatitis does not require previous exposure to the substance for a reaction to occur.Allergic contact dermatitis only occurs if you have been exposed to the substance before. Upon a repeat exposure, your body reacts to the substance.  CAUSES  Many substances can cause contact dermatitis. Irritant dermatitis is most commonly caused by repeated exposure to mildly irritating substances, such as:  Makeup.  Soaps.  Detergents.  Bleaches.  Acids.  Metal salts, such as nickel. Allergic contact dermatitis is most commonly caused by exposure to:  Poisonous plants.  Chemicals (deodorants, shampoos).  Jewelry.  Latex.  Neomycin in triple antibiotic cream.  Preservatives in products, including clothing. SYMPTOMS  The area of skin that is exposed may develop:  Dryness or flaking.  Redness.  Cracks.  Itching.  Pain or a burning sensation.  Blisters. With allergic contact dermatitis, there may also be swelling in areas such as the eyelids, mouth, or genitals.  DIAGNOSIS  Your caregiver can usually tell what the problem is by doing a physical exam. In cases where the cause is uncertain and an allergic contact dermatitis is suspected, a patch skin test may be performed to help determine the cause of your dermatitis. TREATMENT Treatment includes protecting the skin from further contact with the irritating substance by avoiding that substance if possible. Barrier creams, powders, and gloves may be helpful. Your caregiver may also recommend:  Steroid creams or ointments applied 2 times daily. For best results, soak the rash area in cool water for 20 minutes. Then apply the medicine. Cover the area with a plastic wrap. You can store the steroid cream in the refrigerator for a "chilly"  effect on your rash. That may decrease itching. Oral steroid medicines may be needed in more severe cases.  Antibiotics or antibacterial ointments if a skin infection is present.  Antihistamine lotion or an antihistamine taken by mouth to ease itching.  Lubricants to keep moisture in your skin.  Burow's solution to reduce redness and soreness or to dry a weeping rash. Mix one packet or tablet of solution in 2 cups cool water. Dip a clean washcloth in the mixture, wring it out a bit, and put it on the affected area. Leave the cloth in place for 30 minutes. Do this as often as possible throughout the day.  Taking several cornstarch or baking soda baths daily if the area is too large to cover with a washcloth. Harsh chemicals, such as alkalis or acids, can cause skin damage that is like a burn. You should flush your skin for 15 to 20 minutes with cold water after such an exposure. You should also seek immediate medical care after exposure. Bandages (dressings), antibiotics, and pain medicine may be needed for severely irritated skin.  HOME CARE INSTRUCTIONS  Avoid the substance that caused your reaction.  Keep the area of skin that is affected away from hot water, soap, sunlight, chemicals, acidic substances, or anything else that would irritate your skin.  Do not scratch the rash. Scratching may cause the rash to become infected.  You may take cool baths to help stop the itching.  Only take over-the-counter or prescription medicines as directed by your caregiver.  See your caregiver for follow-up care as directed to make sure your skin is healing properly. SEEK MEDICAL CARE IF:   Your condition is not better after 3  days of treatment. °· You seem to be getting worse. °· You see signs of infection such as swelling, tenderness, redness, soreness, or warmth in the affected area. °· You have any problems related to your medicines. °Document Released: 03/18/2000 Document Revised: 06/13/2011  Document Reviewed: 08/24/2010 °ExitCare® Patient Information ©2015 [employer]. This information is not intended to replace advice given to you by your health care provider. Make sure you discuss any questions you have with your health care provider. ° °Poison Oak °Poison oak is an inflammation of the skin (contact dermatitis). It is caused by contact with the allergens on the leaves of the oak (toxicodendron) plants. Depending on your sensitivity, the rash may consist simply of redness and itching, or it may also progress to blisters which may break open (rupture). These must be well cared for to prevent secondary germ (bacterial) infection as these infections can lead to scarring. The eyes may also get puffy. The puffiness is worst in the morning and gets better as the day progresses. Healing is best accomplished by keeping any open areas dry, clean, covered with a bandage, and covered with an antibacterial ointment if needed. Without secondary infection, this dermatitis usually heals without scarring within 2 to 3 weeks without treatment. °HOME CARE INSTRUCTIONS °When you have been exposed to poison oak, it is very important to thoroughly wash with soap and water as soon as the exposure has been discovered. You have about one half hour to remove the plant resin before it will cause the rash. This cleaning will quickly destroy the oil or antigen on the skin (the antigen is what causes the rash). Wash aggressively under the fingernails as any plant resin still there will continue to spread the rash. Do not rub skin vigorously when washing affected area. Poison oak cannot spread if no oil from the plant remains on your body. Rash that has progressed to weeping sores (lesions) will not spread the rash unless you have not washed thoroughly. It is also important to clean any clothes you have been wearing as they may carry active allergens which will spread the rash, even several days later. °Avoidance of the plant in  the future is the best measure. Poison oak plants can be recognized by the number of leaves. Generally, poison oak has three leaves with flowering branches on a single stem. °Diphenhydramine may be purchased over the counter and used as needed for itching. Do not drive with this medication if it makes you drowsy. Ask your caregiver about medication for children. °SEEK IMMEDIATE MEDICAL CARE IF:  °· Open areas of the rash develop. °· You notice redness extending beyond the area of the rash. °· There is a pus like discharge. °· There is increased pain. °· Other signs of infection develop (such as fever). °Document Released: 09/25/2002 Document Revised: 06/13/2011 Document Reviewed: 02/04/2009 °ExitCare® Patient Information ©2015 [employer]. This information is not intended to replace advice given to you by your health care provider. Make sure you discuss any questions you have with your health care provider. ° °

## 2013-11-19 NOTE — ED Notes (Signed)
diffuse swelling, open weeping lesions both arms since 8-13; no relief w calamine lotion or external application of Clorox bleach or H2O2 to lesions

## 2013-11-19 NOTE — ED Provider Notes (Signed)
CSN: [id number]     Arrival date & time 11/19/13  1057 History   First MD Initiated Contact with Patient 11/19/13 1120     Chief Complaint  Patient presents with  . Poison Ivy   (Consider location/radiation/quality/duration/timing/severity/associated sxs/prior Treatment) HPI Comments: Working in yard 4 and 5 d ago and exposed self to weeds. Now with bilateral arm rash,  Red and itchy. Has been putting several meds and chemicals on it without relief.   Past Medical History  Diagnosis Date  . History of fractured rib 2011    4   . Arthritis   . Heart murmur    Past Surgical History  Procedure Laterality Date  . Abdominal hysterectomy  1979  . Cholecystectomy  2004   Family History  Problem Relation Age of Onset  . Prostate cancer Father   . Breast cancer Sister   . Hypertension Sister   . Diabetes Neg Hx   . Stroke Neg Hx    History  Substance Use Topics  . Smoking status: Former Research scientist (life sciences)  . Smokeless tobacco: Never Used  . Alcohol Use: No   OB History   Grav Para Term Preterm Abortions TAB SAB Ect Mult Living                 Review of Systems  Constitutional: Negative.  Negative for fever.  Skin: Positive for color change and rash.  All other systems reviewed and are negative.   Allergies  Review of patient's allergies indicates no known allergies.  Home Medications   Prior to Admission medications   Medication Sig Start Date End Date Taking? Authorizing Provider  Calcium Carbonate-Vit D-Min (EQ CALCIUM 600+D+MINERALS PO) Take 1 tablet by mouth daily.    Historical Provider, MD  methylPREDNISolone (MEDROL DOSEPAK) 4 MG tablet As directed. Take with food. Start 11/20/13 11/19/13   Janne Napoleon, NP  Multiple Vitamin (MULTIVITAMIN WITH MINERALS) TABS Take 2 tablets by mouth daily.    Historical Provider, MD  triamcinolone cream (KENALOG) 0.1 % Apply topically 2 (two) times daily. 09/10/12   Webb Silversmith, NP  triamcinolone cream (KENALOG) 0.1 % Apply 1 application  topically 2 (two) times daily. 11/19/13   Janne Napoleon, NP   BP 116/77  Pulse 78  Temp(Src) 98.1 F (36.7 C) (Oral)  Resp 20  SpO2 96% Physical Exam  Nursing note and vitals reviewed. Constitutional: She is oriented to person, place, and time. She appears well-developed and well-nourished. No distress.  Cardiovascular: Normal rate.   Pulmonary/Chest: Effort normal.  Musculoskeletal: She exhibits edema.  Neurological: She is alert and oriented to person, place, and time.  Skin: Skin is warm and dry.  L arm with confluent erythema flexor surface and scattered papulovesicular lesions, many with clear,watery drainage. Lesser rash of same type to right arm.  No lymphangitis. Some underlying swelling and induration. No abscess formation. No signs of infection.    ED Course  Procedures (including critical care time) Labs Review Labs Reviewed - No data to display  Imaging Review No results found.   MDM   1. Contact dermatitis due to plants, except food    Medrol dose pack sulumedrol 80 mg IM Triamcinolone cream bid Calamine lotion For signs of infection return    Janne Napoleon, NP 11/19/13 1206

## 2013-11-19 NOTE — ED Provider Notes (Signed)
Medical screening examination/treatment/procedure(s) were performed by resident physician or non-physician practitioner and as supervising physician I was immediately available for consultation/collaboration.   Pauline Good MD.   Billy Fischer, MD 11/19/13 [phone]

## 2015-09-10 ENCOUNTER — Telehealth: Payer: Self-pay | Admitting: Internal Medicine

## 2015-09-10 NOTE — Telephone Encounter (Signed)
Left message asking pt to call office per thn Needs to schedule either follow up or cpx with pcp Last appointment 10/23/12 almost 3 years

## 2015-09-10 NOTE — Telephone Encounter (Signed)
Pt stated she as been taking care of her spouse and thought she was going to put him in adult daycare  She will call back next week to schedule appointment

## 2015-09-11 ENCOUNTER — Encounter: Payer: Self-pay | Admitting: Family Medicine

## 2015-09-11 ENCOUNTER — Ambulatory Visit (INDEPENDENT_AMBULATORY_CARE_PROVIDER_SITE_OTHER): Payer: Medicare Other | Admitting: Family Medicine

## 2015-09-11 VITALS — BP 138/64 | HR 58 | Temp 98.3°F | Ht 68.0 in | Wt 162.7 lb

## 2015-09-11 DIAGNOSIS — L03119 Cellulitis of unspecified part of limb: Secondary | ICD-10-CM | POA: Diagnosis not present

## 2015-09-11 MED ORDER — CEPHALEXIN 500 MG PO CAPS: 500.0000 mg | ORAL_CAPSULE | Freq: Three times a day (TID) | ORAL | Status: AC

## 2015-09-11 NOTE — Progress Notes (Signed)
   Subjective:    Patient ID: Ashlee Hamilton, female    DOB: 18-Dec-1940, 75 y.o.   MRN: [id number]  HPI Here for a painful swollen foot. 3 days ago while mulching some leaves in her yard with the lawn mower she felt a sudden sharp stinging pain in the left foot, and she assumed this was a bee sting. She finished mulching and felt fine in general, however about an hour later she noticed her foot was swelling. Since then the foot is swollen and warm and painful, no fever.   Review of Systems  Constitutional: Negative.   Respiratory: Negative.   Cardiovascular: Negative.   Skin: Positive for color change.  Neurological: Negative.        Objective:   Physical Exam  Constitutional: She appears well-developed and well-nourished.  Skin:  The left lateral foot and ankle are swollen, pink, warm, and tender. There are 2 small vesicles present.           Assessment & Plan:  Cellulitis from what was probably a yellow jacket sting. Elevated the foot and use ice packs. Cover with Keflex. Recheck prn. Laurey Morale, MD

## 2015-10-08 ENCOUNTER — Ambulatory Visit (INDEPENDENT_AMBULATORY_CARE_PROVIDER_SITE_OTHER)
Admission: RE | Admit: 2015-10-08 | Discharge: 2015-10-08 | Disposition: A | Discharge status: Home / Self Care | Payer: Medicare Other | Source: Ambulatory Visit | Attending: Family Medicine | Admitting: Family Medicine

## 2015-10-08 ENCOUNTER — Encounter: Payer: Self-pay | Admitting: Family Medicine

## 2015-10-08 ENCOUNTER — Ambulatory Visit (INDEPENDENT_AMBULATORY_CARE_PROVIDER_SITE_OTHER): Payer: Medicare Other | Admitting: Family Medicine

## 2015-10-08 VITALS — BP 136/62 | HR 54 | Temp 98.3°F | Wt 161.5 lb

## 2015-10-08 DIAGNOSIS — S4991XA Unspecified injury of right shoulder and upper arm, initial encounter: Secondary | ICD-10-CM

## 2015-10-08 DIAGNOSIS — M79601 Pain in right arm: Secondary | ICD-10-CM

## 2015-10-08 DIAGNOSIS — M25511 Pain in right shoulder: Secondary | ICD-10-CM | POA: Diagnosis not present

## 2015-10-08 DIAGNOSIS — S4990XA Unspecified injury of shoulder and upper arm, unspecified arm, initial encounter: Secondary | ICD-10-CM | POA: Insufficient documentation

## 2015-10-08 MED ORDER — DEXAMETHASONE SODIUM PHOSPHATE 10 MG/ML IJ SOLN
10.0000 mg | Freq: Once | INTRAMUSCULAR | Status: AC
  Administered 2015-10-08: 10 mg via INTRAMUSCULAR

## 2015-10-08 NOTE — Assessment & Plan Note (Signed)
New- decreased ROM with pos rotator cuff injury signs. Xray of right shoulder. IM decadron given in office- advised to take only tylenol for next 2 days- to decrease acute inflammation and hopefully improve functioning somewhat. Right shoulder xray. Make appt to see Dr. Lorelei Pont.
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
## 2015-10-08 NOTE — Patient Instructions (Signed)
Good to see you.   Take tylenol twice daily.  I will call you with your xray results.  Please schedule an appointment to see Dr. Lorelei Pont on your way out.

## 2015-10-15 ENCOUNTER — Ambulatory Visit (INDEPENDENT_AMBULATORY_CARE_PROVIDER_SITE_OTHER): Payer: Medicare Other

## 2015-10-15 ENCOUNTER — Telehealth: Payer: Self-pay

## 2015-10-15 VITALS — BP 130/80 | HR 54 | Temp 98.1°F | Ht 68.0 in | Wt 158.2 lb

## 2015-10-15 DIAGNOSIS — Z23 Encounter for immunization: Secondary | ICD-10-CM | POA: Diagnosis not present

## 2015-10-15 DIAGNOSIS — Z0000 Encounter for general adult medical examination without abnormal findings: Secondary | ICD-10-CM | POA: Diagnosis not present

## 2015-10-15 DIAGNOSIS — Z1239 Encounter for other screening for malignant neoplasm of breast: Secondary | ICD-10-CM

## 2015-10-15 DIAGNOSIS — E2839 Other primary ovarian failure: Secondary | ICD-10-CM
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
## 2015-10-15 NOTE — Progress Notes (Signed)
Medical screening examination/treatment/procedure(s) were performed by registered nurse and as supervising non-physician practitioner, I was immediately available for consultation/collaboration.   Harjas Biggins, NP  

## 2015-10-15 NOTE — Patient Instructions (Signed)
Ashlee Hamilton , Thank you for taking time to come for your Medicare Wellness Visit. I appreciate your ongoing commitment to your health goals. Please review the following plan we discussed and let me know if I can assist you in the future.   These are the goals we discussed: Goals    . Increase water intake     Starting 10/15/2015, I will continue to drink at least 6-8 glasses of water daily.       This is a list of the screening recommended for you and due dates:  Health Maintenance  Topic Date Due  . Mammogram  01/03/2016*  . DEXA scan (bone density measurement)  01/03/2016*  . Colon Cancer Screening  01/03/2016*  . Flu Shot  11/03/2015  . Tetanus Vaccine  07/19/2022  . Shingles Vaccine  Completed  . Pneumonia vaccines  Completed  *Topic was postponed. The date shown is not the original due date.    Preventive Care for Adults  A healthy lifestyle and preventive care can promote health and wellness. Preventive health guidelines for adults include the following key practices.  . A routine yearly physical is a good way to check with your health care provider about your health and preventive screening. It is a chance to share any concerns and updates on your health and to receive a thorough exam.  . Visit your dentist for a routine exam and preventive care every 6 months. Brush your teeth twice a day and floss once a day. Good oral hygiene prevents tooth decay and gum disease.  . The frequency of eye exams is based on your age, health, family medical history, use  of contact lenses, and other factors. Follow your health care provider's ecommendations for frequency of eye exams.  . Eat a healthy diet. Foods like vegetables, fruits, whole grains, low-fat dairy products, and lean protein foods contain the nutrients you need without too many calories. Decrease your intake of foods high in solid fats, added sugars, and salt. Eat the right amount of calories for you. Get information about a  proper diet from your health care provider, if necessary.  . Regular physical exercise is one of the most important things you can do for your health. Most adults should get at least 150 minutes of moderate-intensity exercise (any activity that increases your heart rate and causes you to sweat) each week. In addition, most adults need muscle-strengthening exercises on 2 or more days a week.  Silver Sneakers may be a benefit available to you. To determine eligibility, you may visit the website: www.silversneakers.com or contact program at (260)530-1505 Mon-Fri between 8AM-8PM.   . Maintain a healthy weight. The body mass index (BMI) is a screening tool to identify possible weight problems. It provides an estimate of body fat based on height and weight. Your health care provider can find your BMI and can help you achieve or maintain a healthy weight.   For adults 20 years and older: ? A BMI below 18.5 is considered underweight. ? A BMI of 18.5 to 24.9 is normal. ? A BMI of 25 to 29.9 is considered overweight. ? A BMI of 30 and above is considered obese.   . Maintain normal blood lipids and cholesterol levels by exercising and minimizing your intake of saturated fat. Eat a balanced diet with plenty of fruit and vegetables. Blood tests for lipids and cholesterol should begin at age 46 and be repeated every 5 years. If your lipid or cholesterol levels are high, you  are over 35, or you are at high risk for heart disease, you may need your cholesterol levels checked more frequently. Ongoing high lipid and cholesterol levels should be treated with medicines if diet and exercise are not working.  . If you smoke, find out from your health care provider how to quit. If you do not use tobacco, please do not start.  . If you choose to drink alcohol, please do not consume more than 2 drinks per day. One drink is considered to be 12 ounces (355 mL) of beer, 5 ounces (148 mL) of wine, or 1.5 ounces (44 mL) of  liquor.  . If you are 23-27 years old, ask your health care provider if you should take aspirin to prevent strokes.  . Use sunscreen. Apply sunscreen liberally and repeatedly throughout the day. You should seek shade when your shadow is shorter than you. Protect yourself by wearing long sleeves, pants, a wide-brimmed hat, and sunglasses year round, whenever you are outdoors.  . Once a month, do a whole body skin exam, using a mirror to look at the skin on your back. Tell your health care provider of new moles, moles that have irregular borders, moles that are larger than a pencil eraser, or moles that have changed in shape or color.

## 2015-10-15 NOTE — Telephone Encounter (Signed)
Orders generated for mammogram and bone density scan.

## 2015-10-15 NOTE — Telephone Encounter (Signed)
noted 

## 2015-10-15 NOTE — Progress Notes (Signed)
Pre visit review using our clinic review tool, if applicable. No additional management support is needed unless otherwise documented below in the visit note. 

## 2015-10-15 NOTE — Progress Notes (Signed)
Subjective:   Ashlee Hamilton is a 75 y.o. female who presents for an Initial Medicare Annual Wellness Visit.  Review of Systems    N/A  Cardiac Risk Factors include: advanced age (>93men, >10 women);dyslipidemia     Objective:    Today's Vitals   10/15/15 1048 10/15/15 1100  BP: 130/80   Pulse: 54   Temp: 98.1 F (36.7 C)   TempSrc: Oral   Height: 5\' 8"  (1.727 m)   Weight: 158 lb 4 oz (71.782 kg)   SpO2: 96%   PainSc: 9  9   PainLoc: Shoulder    Body mass index is 24.07 kg/(m^2).   Current Medications (verified) Outpatient Encounter Prescriptions as of 10/15/2015  Medication Sig  . Calcium Carbonate-Vit D-Min (EQ CALCIUM 600+D+MINERALS PO) Take 1 tablet by mouth daily.  . Multiple Vitamin (MULTIVITAMIN WITH MINERALS) TABS Take 2 tablets by mouth daily.   No facility-administered encounter medications on file as of 10/15/2015.    Allergies (verified) Review of patient's allergies indicates no known allergies.   History: Past Medical History  Diagnosis Date  . History of fractured rib 2011    4   . Arthritis   . Heart murmur    Past Surgical History  Procedure Laterality Date  . Abdominal hysterectomy  1979  . Cholecystectomy  2004   Family History  Problem Relation Age of Onset  . Prostate cancer Father   . Breast cancer Sister   . Hypertension Sister   . Diabetes Neg Hx   . Stroke Neg Hx    Social History   Occupational History  . Retired    Social History Main Topics  . Smoking status: Former Research scientist (life sciences)  . Smokeless tobacco: Never Used  . Alcohol Use: No  . Drug Use: No  . Sexual Activity: Not Currently    Tobacco Counseling Counseling given: No   Activities of Daily Living In your present state of health, do you have any difficulty performing the following activities: 10/15/2015  Hearing? N  Vision? N  Difficulty concentrating or making decisions? N  Walking or climbing stairs? N  Dressing or bathing? N  Doing errands, shopping? N    Preparing Food and eating ? N  Using the Toilet? N  In the past six months, have you accidently leaked urine? N  Do you have problems with loss of bowel control? N  Managing your Medications? N  Managing your Finances? N  Housekeeping or managing your Housekeeping? N    Immunizations and Health Maintenance Immunization History  Administered Date(s) Administered  . Pneumococcal Polysaccharide-23 07/24/2012  . Tdap 07/18/2012  . Zoster 08/21/2012   There are no preventive care reminders to display for this patient.  Patient Care Team: Jearld Fenton, NP as PCP - General (Internal Medicine) Macarthur Critchley, [place] as Referring Physician (Optometry)    Assessment:   This is a routine wellness examination for [name].   Hearing/Vision screen  Hearing Screening   125Hz  250Hz  500Hz  1000Hz  2000Hz  4000Hz  8000Hz   Right ear:   40 40 40 40   Left ear:   40 40 40 40     Visual Acuity Screening   Right eye Left eye Both eyes  Without correction:     With correction: 20/50 20/25 20/25     Dietary issues and exercise activities discussed: Current Exercise Habits: The patient does not participate in regular exercise at present, Exercise limited by: None identified  Goals    . Increase water intake  Starting 10/15/2015, I will continue to drink at least 6-8 glasses of water daily.      Depression Screen PHQ 2/9 Scores 10/15/2015 07/18/2012  PHQ - 2 Score 0 1    Fall Risk Fall Risk  10/15/2015 07/18/2012  Falls in the past year? No No    Cognitive Function: MMSE - Mini Mental State Exam 10/15/2015  Orientation to time 5  Orientation to Place 5  Registration 3  Attention/ Calculation 0  Recall 3  Language- name 2 objects 0  Language- repeat 1  Language- follow 3 step command 3  Language- read & follow direction 0  Write a sentence 0  Copy design 0  Total score 20   PLEASE NOTE: A Mini-Cog screen was completed. Maximum score is 20. A value of 0 denotes this part of Folstein MMSE  was not completed or the patient failed this part of the Mini-Cog screening.   Mini-Cog Screening Orientation to Time - Max 5 pts Orientation to Place - Max 5 pts Registration - Max 3 pts Recall - Max 3 pts Language Repeat - Max 1 pts Language Follow 3 Step Command - Max 3 pts  Screening Tests Health Maintenance  Topic Date Due  . MAMMOGRAM  01/03/2016 (Originally 07/09/2008)  . DEXA SCAN  01/03/2016 (Originally 12/17/2007)  . COLONOSCOPY  01/03/2016 (Originally 10/26/2013)  . INFLUENZA VACCINE  11/03/2015  . TETANUS/TDAP  07/19/2022  . ZOSTAVAX  Completed  . PNA vac Low Risk Adult  Completed      Plan:     I have personally reviewed and addressed the Medicare Annual Wellness questionnaire and have noted the following in the patient's chart:  A. Medical and social history B. Use of alcohol, tobacco or illicit drugs  C. Current medications and supplements D. Functional ability and status E.  Nutritional status F.  Physical activity G. Advance directives H. List of other physicians I.  Hospitalizations, surgeries, and ER visits in previous 12 months J.  [place] to include hearing, vision, cognitive, depression L. Referrals and appointments - none  In addition, I have reviewed and discussed with patient certain preventive protocols, quality metrics, and best practice recommendations. A written personalized care plan for preventive services as well as general preventive health recommendations were provided to patient.  See attached scanned questionnaire for additional information.   Signed,   Lindell Noe, MHA, BS, LPN Health Advisor

## 2015-10-22 ENCOUNTER — Encounter: Payer: Self-pay | Admitting: Gastroenterology

## 2015-10-22 ENCOUNTER — Encounter: Payer: Self-pay | Admitting: Internal Medicine

## 2015-10-22 ENCOUNTER — Ambulatory Visit (INDEPENDENT_AMBULATORY_CARE_PROVIDER_SITE_OTHER): Payer: Medicare Other | Admitting: Internal Medicine

## 2015-10-22 VITALS — BP 122/80 | HR 49 | Temp 98.0°F | Ht 68.0 in | Wt 158.5 lb

## 2015-10-22 DIAGNOSIS — Z1211 Encounter for screening for malignant neoplasm of colon: Secondary | ICD-10-CM | POA: Diagnosis not present

## 2015-10-22 DIAGNOSIS — Z0001 Encounter for general adult medical examination with abnormal findings: Secondary | ICD-10-CM | POA: Diagnosis not present

## 2015-10-22 DIAGNOSIS — Z1321 Encounter for screening for nutritional disorder: Secondary | ICD-10-CM

## 2015-10-22 DIAGNOSIS — M25511 Pain in right shoulder: Secondary | ICD-10-CM

## 2015-10-22 LAB — LIPID PANEL
Cholesterol: 199 mg/dL (ref 0–200)
HDL: 68.2 mg/dL (ref >39.00)
LDL Cholesterol: 112 mg/dL — ABNORMAL HIGH (ref 0–99)
NONHDL: 130.61
TRIGLYCERIDES: 92 mg/dL (ref 0.0–149.0)
Total CHOL/HDL Ratio: 3
VLDL: 18.4 mg/dL (ref 0.0–40.0)

## 2015-10-22 LAB — COMPREHENSIVE METABOLIC PANEL
ALK PHOS: 85 U/L (ref 39–117)
ALT: 17 U/L (ref 0–35)
AST: 26 U/L (ref 0–37)
Albumin: 4.5 g/dL (ref 3.5–5.2)
BILIRUBIN TOTAL: 0.5 mg/dL (ref 0.2–1.2)
BUN: 6 mg/dL (ref 6–23)
CALCIUM: 9.9 mg/dL (ref 8.4–10.5)
CO2: 31 mEq/L (ref 19–32)
Chloride: 101 mEq/L (ref 96–112)
Creatinine, Ser: 0.74 mg/dL (ref 0.40–1.20)
GFR: 81.8 mL/min (ref >60.00)
GLUCOSE: 100 mg/dL — AB (ref 70–99)
Potassium: 4.6 mEq/L (ref 3.5–5.1)
Sodium: 139 mEq/L (ref 135–145)
TOTAL PROTEIN: 7.8 g/dL (ref 6.0–8.3)

## 2015-10-22 LAB — CBC
HCT: 42.9 % (ref 36.0–46.0)
HEMOGLOBIN: 14.2 g/dL (ref 12.0–15.0)
MCHC: 33 g/dL (ref 30.0–36.0)
MCV: 90.6 fl (ref 78.0–100.0)
PLATELETS: 182 10*3/uL (ref 150.0–400.0)
RBC: 4.74 Mil/uL (ref 3.87–5.11)
RDW: 14 % (ref 11.5–15.5)
WBC: 5.7 10*3/uL (ref 4.0–10.5)

## 2015-10-22 LAB — VITAMIN D 25 HYDROXY (VIT D DEFICIENCY, FRACTURES): VITD: 41.79 ng/mL (ref 30.00–100.00)

## 2015-10-22 NOTE — Addendum Note (Signed)
Addended by: Marchia Bond on: 10/22/2015 04:26 PM   Modules accepted: Miquel Dunn

## 2015-10-22 NOTE — Progress Notes (Signed)
Subjective:    Patient ID: Ashlee Hamilton, female    DOB: 1940/05/04, 75 y.o.   MRN: [id number]  HPI  Pt presents today for her annual exam.    Flu: never Tetanus: 07/2012 Pneumo: 07/2012 Prevnar: 10/2015 Zostavax: 08/2012 Mammo: 2008 Pap: 2008 Bone density: more than 5 yrs Colon: 10 years ago or more Vision: as needed  Dentist: as needed  Diet: She does eat meat. She consumes meats, fruits, and vegetables. She tries to avoid fried foods. She drinks water throughout the day. Exercise: None  She complains of right shoulder pain and has an appointment scheduled with Dr. Edilia Bo tomorrow.    Review of Systems      Past Medical History  Diagnosis Date  . History of fractured rib 2011    4   . Arthritis   . Heart murmur     Current Outpatient Prescriptions  Medication Sig Dispense Refill  . Calcium Carbonate-Vit D-Min (EQ CALCIUM 600+D+MINERALS PO) Take 1 tablet by mouth daily.    . Multiple Vitamin (MULTIVITAMIN WITH MINERALS) TABS Take 2 tablets by mouth daily.     No current facility-administered medications for this visit.    No Known Allergies  Family History  Problem Relation Age of Onset  . Prostate cancer Father   . Breast cancer Sister   . Hypertension Sister   . Diabetes Neg Hx   . Stroke Neg Hx     Social History   Social History  . Marital Status: Married    Spouse Name: N/A  . Number of Children: N/A  . Years of Education: 12   Occupational History  . Retired    Social History Main Topics  . Smoking status: Former Research scientist (life sciences)  . Smokeless tobacco: Never Used  . Alcohol Use: No  . Drug Use: No  . Sexual Activity: Not Currently   Other Topics Concern  . Not on file   Social History Narrative   Regular exercise-no   Caffeine Use-no     Constitutional: Denies fever, malaise, fatigue, headache or abrupt weight changes.  HEENT: Denies eye pain, eye redness, ear pain, ringing in the ears, wax buildup, runny nose, nasal congestion,  bloody nose, or sore throat. Respiratory: Denies difficulty breathing, shortness of breath, cough or sputum production.   Cardiovascular: Denies chest pain, chest tightness, palpitations or swelling in the hands or feet.  Gastrointestinal: Denies abdominal pain, bloating, constipation, diarrhea or blood in the stool.  GU: Denies urgency, frequency, pain with urination, burning sensation, blood in urine, odor or discharge. Musculoskeletal: Pt reports right shoulder pain.  Denies decrease in range of motion, difficulty with gait, muscle pain or joint swelling.  Skin: Denies redness, rashes, lesions or ulcercations.  Neurological: Denies dizziness, difficulty with memory, difficulty with speech or problems with balance and coordination.  Psych: Denies anxiety, depression, SI/HI.  No other specific complaints in a complete review of systems (except as listed in HPI above).  Objective:   Physical Exam  BP 122/80 mmHg  Pulse 49  Temp(Src) 98 F (36.7 C) (Oral)  Ht 5' 8"  (1.727 m)  Wt 158 lb 8 oz (71.895 kg)  BMI 24.11 kg/m2  SpO2 98% Wt Readings from Last 3 Encounters:  10/22/15 158 lb 8 oz (71.895 kg)  10/15/15 158 lb 4 oz (71.782 kg)  10/08/15 161 lb 8 oz (73.256 kg)    General: Appears stated age, well developed, well nourished in NAD. Skin: Warm, dry and intact.  HEENT: Head: normal shape and  size; Eyes: sclera white, no icterus, conjunctiva pink; Ears: Tm's gray and intact, normal light reflex; Nose: mucosa pink and moist, septum midline; Throat/Mouth: Teeth present, mucosa pink and moist, no exudate, lesions or ulcerations noted.  Neck:  Neck supple, trachea midline. No lymphadenopathy present.  Cardiovascular: Normal rate and rhythm. S1,S2 noted.  No murmur, rubs or gallops noted. No JVD or BLE edema. No carotid bruits noted. Pulmonary/Chest: Normal effort and positive vesicular breath sounds. No respiratory distress. No wheezes, rales or ronchi noted.  Abdomen: Soft and  nontender. Normal bowel sounds No distention or masses noted. Liver, spleen and kidneys non palpable. Musculoskeletal:  Normal range of motion. 5/5 Strength bilateral UE, though unable to assess shoulder abduction strength secondary to right shoulder pain.  Strength 5/5 LE bilaterally.    Neurological: Alert and oriented. Cranial nerves II-XII grossly  intact. Coordination normal.  Psychiatric: Mood and affect normal. Behavior is normal. Judgment and thought content normal.    BMET    Component Value Date/Time   NA 139 07/18/2012 1523   K 4.3 07/18/2012 1523   CL 101 07/18/2012 1523   CO2 30 07/18/2012 1523   GLUCOSE 91 07/18/2012 1523   BUN 9 07/18/2012 1523   CREATININE 0.7 07/18/2012 1523   CALCIUM 9.9 07/18/2012 1523   GFRNONAA >60 02/19/2010 1736   GFRAA  02/19/2010 1736    >60        The eGFR has been calculated using the MDRD equation. This calculation has not been validated in all clinical situations. eGFR's persistently <60 mL/min signify possible Chronic Kidney Disease.    Lipid Panel     Component Value Date/Time   CHOL 199 10/23/2012 0907   TRIG 63.0 10/23/2012 0907   HDL 57.60 10/23/2012 0907   CHOLHDL 3 10/23/2012 0907   VLDL 12.6 10/23/2012 0907   LDLCALC 129* 10/23/2012 0907    CBC    Component Value Date/Time   WBC 6.4 07/18/2012 1523   RBC 4.74 07/18/2012 1523   HGB 14.2 07/18/2012 1523   HCT 42.8 07/18/2012 1523   PLT 184.0 07/18/2012 1523   MCV 90.5 07/18/2012 1523   MCH 30.9 02/19/2010 1736   MCHC 33.2 07/18/2012 1523   RDW 13.9 07/18/2012 1523   LYMPHSABS 0.6* 02/19/2010 1736   MONOABS 0.9 02/19/2010 1736   EOSABS 0.0 02/19/2010 1736   BASOSABS 0.0 02/19/2010 1736    Hgb A1C No results found for: HGBA1C       Assessment & Plan:   Health Maintenance:  Encouraged her to get a flu shot in the fall Tetanus , Prevnar, Pneumovax and Zostovax up to date She will schedule Mammogram and Bone density this year Referral to GI for  colonoscopy for colon cancer screening Continue routine vision and dental exams Encouraged her to consume balanced diet and exercise regimen CBC, CMP, lipid panel and Vit D today  Right Shoulder Pain:  Appt scheduled with Dr. Edilia Bo for tomorrow for evaluation  Follow-up in 1 year for annual exam or sooner if needed Webb Silversmith, NP

## 2015-10-22 NOTE — Patient Instructions (Signed)

## 2015-10-22 NOTE — Progress Notes (Signed)
Changed dx for pneumonia vaccine to prevent billing error.

## 2015-10-22 NOTE — Progress Notes (Signed)
Pre visit review using our clinic review tool, if applicable. No additional management support is needed unless otherwise documented below in the visit note. 

## 2015-10-23 ENCOUNTER — Encounter: Payer: Self-pay | Admitting: Family Medicine

## 2015-10-23 ENCOUNTER — Ambulatory Visit (INDEPENDENT_AMBULATORY_CARE_PROVIDER_SITE_OTHER): Payer: Medicare Other | Admitting: Family Medicine

## 2015-10-23 VITALS — BP 124/60 | HR 55 | Temp 98.0°F | Wt 158.8 lb

## 2015-10-23 DIAGNOSIS — M7551 Bursitis of right shoulder: Secondary | ICD-10-CM | POA: Diagnosis not present

## 2015-10-23 DIAGNOSIS — M7521 Bicipital tendinitis, right shoulder: Secondary | ICD-10-CM | POA: Diagnosis not present

## 2015-10-23 DIAGNOSIS — M7581 Other shoulder lesions, right shoulder: Secondary | ICD-10-CM

## 2015-10-23 NOTE — Progress Notes (Signed)
Pre visit review using our clinic review tool, if applicable. No additional management support is needed unless otherwise documented below in the visit note. 

## 2015-10-23 NOTE — Progress Notes (Signed)
Dr. Karleen Hampshire T. Winter Trefz, MD, CAQ Sports Medicine [facility] [address] [place], [address] Phone: [phone] Fax: [phone]  10/23/2015  Patient: Ashlee Hamilton, MRN: [id number], DOB: July 17, 1940, 75 y.o.  Primary Physician:  Nicki Reaper, NP   Chief Complaint  Patient presents with  . Shoulder Pain    right   Subjective:   This 75 y.o. female patient noted above presents with shoulder pain that has been ongoing for 3 weeks there is no history of trauma or accident recently The patient denies neck pain or radicular symptoms. Denies dislocation, subluxation, separation of the shoulder. The patient does complain of pain in the overhead plane with significant painful arc of motion.  Husband has Alzheimer's - did beat on her shoulder.  Movement certain ways will hurt.   Medications Tried: Tylenol, NSAIDS Ice or Heat: minimally helpful Tried PT: No  Prior shoulder Injury: No Prior surgery: No Prior fracture: No  The PMH, PSH, Social History, Family History, Medications, and allergies have been reviewed in [facility], and have been updated if relevant.  Patient Active Problem List   Diagnosis Date Noted  . Other and unspecified hyperlipidemia 10/23/2012    Past Medical History:  Diagnosis Date  . Arthritis   . Heart murmur   . History of fractured rib 2011   4     Past Surgical History:  Procedure Laterality Date  . ABDOMINAL HYSTERECTOMY  1979  . CHOLECYSTECTOMY  2004    Social History   Social History  . Marital status: Married    Spouse name: N/A  . Number of children: N/A  . Years of education: 63   Occupational History  . Retired    Social History Main Topics  . Smoking status: Former Games developer  . Smokeless tobacco: Never Used  . Alcohol use No  . Drug use: No  . Sexual activity: Not Currently   Other Topics Concern  . Not on file   Social History Narrative   Regular exercise-no   Caffeine Use-no    Family  History  Problem Relation Age of Onset  . Prostate cancer Father   . Breast cancer Sister   . Hypertension Sister   . Diabetes Neg Hx   . Stroke Neg Hx     No Known Allergies  Medication list reviewed and updated in full in [facility].  GEN: No fevers, chills. Nontoxic. Primarily MSK c/o today. MSK: Detailed in the HPI GI: tolerating PO intake without difficulty Neuro: No numbness, parasthesias, or tingling associated. Otherwise the pertinent positives of the ROS are noted above.   Objective:   Blood pressure 124/60, pulse (!) 55, temperature 98 F (36.7 C), temperature source Oral, weight 158 lb 12 oz (72 kg), SpO2 98 %.  GEN: Well-developed,well-nourished,in no acute distress; alert,appropriate and cooperative throughout examination HEENT: Normocephalic and atraumatic without obvious abnormalities. Ears, externally no deformities PULM: Breathing comfortably in no respiratory distress EXT: No clubbing, cyanosis, or edema PSYCH: Normally interactive. Cooperative during the interview. Pleasant. Friendly and conversant. Not anxious or depressed appearing. Normal, full affect.  Shoulder: R Inspection: No muscle wasting or winging Ecchymosis/edema: neg  AC joint, scapula, clavicle: NT Cervical spine: NT, full ROM Spurling's: neg Abduction: full, 5/5 Flexion: full, 5/5 IR, full, lift-off: 5/5 ER at neutral: full, 5/5 AC crossover: neg Neer: pos Hawkins: pos Drop Test: neg Empty Can: pos Supraspinatus insertion: mild-mod T Bicipital groove: NT Speed's: neg Yergason's: neg Sulcus sign: neg Scapular dyskinesis:  none C5-T1 intact  Neuro: Sensation intact Grip 5/5   Radiology: No results found.  Assessment and Plan:    Rotator cuff tendonitis, right - Plan: Ambulatory referral to Physical Therapy  Subacromial bursitis, right - Plan: Ambulatory referral to Physical Therapy  Biceps tendonitis on right - Plan: Ambulatory referral to Physical  Therapy  Rotator cuff strengthening and scapular stabilization exercises were reviewed with the patient. Retraining shoulder mechanics and function was emphasized to the patient with rehab done at least 5-6 days a week.  The patient could benefit from formal PT to assist with scapular stabilization and RTC strengthening. Finances limited - ? If can go to PT Reviewed [facility] shoulder program in detail.   Follow-up: Return in about 6 weeks (around 12/04/2015).  Orders Placed This Encounter  Procedures  . Ambulatory referral to Physical Therapy    Signed,  Karleen Hampshire T. Florie Carico, MD   Patient's Medications  New Prescriptions   No medications on file  Previous Medications   CALCIUM CARBONATE-VIT D-MIN (EQ CALCIUM 600+D+MINERALS PO)    Take 1 tablet by mouth daily.   MULTIPLE VITAMIN (MULTIVITAMIN WITH MINERALS) TABS    Take 2 tablets by mouth daily.  Modified Medications   No medications on file  Discontinued Medications   No medications on file

## 2015-10-23 NOTE — Patient Instructions (Signed)

## 2015-11-06 ENCOUNTER — Ambulatory Visit
Admission: RE | Admit: 2015-11-06 | Discharge: 2015-11-06 | Disposition: A | Discharge status: Home / Self Care | Payer: Medicare Other | Source: Ambulatory Visit | Attending: Internal Medicine | Admitting: Internal Medicine

## 2015-11-06 DIAGNOSIS — Z1231 Encounter for screening mammogram for malignant neoplasm of breast: Secondary | ICD-10-CM | POA: Diagnosis not present

## 2015-11-06 DIAGNOSIS — Z13820 Encounter for screening for osteoporosis: Secondary | ICD-10-CM | POA: Diagnosis not present

## 2015-11-06 DIAGNOSIS — Z780 Asymptomatic menopausal state: Secondary | ICD-10-CM | POA: Diagnosis not present

## 2015-11-06 DIAGNOSIS — E2839 Other primary ovarian failure: Secondary | ICD-10-CM

## 2015-11-06 DIAGNOSIS — Z1239 Encounter for other screening for malignant neoplasm of breast: Secondary | ICD-10-CM

## 2015-11-09 NOTE — Progress Notes (Signed)
Pre visit review using our clinic review tool, if applicable. No additional management support is needed unless otherwise documented below in the visit note. 

## 2015-12-18 ENCOUNTER — Ambulatory Visit: Payer: Medicare Other | Admitting: *Deleted

## 2015-12-18 VITALS — Ht 68.0 in | Wt 164.2 lb

## 2015-12-18 DIAGNOSIS — Z86010 Personal history of colonic polyps: Secondary | ICD-10-CM

## 2015-12-18 MED ORDER — SUPREP BOWEL PREP KIT 17.5-3.13-1.6 GM/177ML PO SOLN: 1.0000 | Freq: Once | ORAL | 0 refills | Status: AC

## 2015-12-18 NOTE — Progress Notes (Signed)
Patient denies any allergies to egg or soy products. Patient denies complications with anesthesia/sedation.  Patient denies oxygen use at home and denies diet medications. Emmi instructions for colonoscopy explained but patient denied.     

## 2015-12-21 ENCOUNTER — Encounter: Payer: Self-pay | Admitting: Gastroenterology

## 2016-01-01 ENCOUNTER — Ambulatory Visit (AMBULATORY_SURGERY_CENTER): Payer: Medicare Other | Admitting: Gastroenterology

## 2016-01-01 ENCOUNTER — Encounter: Payer: Self-pay | Admitting: Gastroenterology

## 2016-01-01 VITALS — BP 115/55 | HR 54 | Temp 98.6°F | Resp 15 | Ht 68.0 in | Wt 164.0 lb

## 2016-01-01 DIAGNOSIS — Z1211 Encounter for screening for malignant neoplasm of colon: Secondary | ICD-10-CM | POA: Diagnosis not present

## 2016-01-01 DIAGNOSIS — D123 Benign neoplasm of transverse colon: Secondary | ICD-10-CM | POA: Diagnosis not present

## 2016-01-01 DIAGNOSIS — Z86010 Personal history of colonic polyps: Secondary | ICD-10-CM | POA: Diagnosis present

## 2016-01-01 MED ORDER — SODIUM CHLORIDE 0.9 % IV SOLN: 500.0000 mL | INTRAVENOUS | Status: DC

## 2016-01-01 NOTE — Op Note (Signed)
[place] Patient Name: Ashlee Hamilton Procedure Date: 01/01/2016 10:42 AM MRN: [id number] Endoscopist: Mauri Pole , MD Age: 75 Referring MD:  Date of Birth: 1940-06-03 Gender: Female Account #: [id number] Procedure:                Colonoscopy Indications:              Screening for colorectal malignant neoplasm, Last                            colonoscopy: 2005 Medicines:                Monitored Anesthesia Care Procedure:                Pre-Anesthesia Assessment:                           - Prior to the procedure, a History and Physical                            was performed, and patient medications and                            allergies were reviewed. The patient's tolerance of                            previous anesthesia was also reviewed. The risks                            and benefits of the procedure and the sedation                            options and risks were discussed with the patient.                            All questions were answered, and informed consent                            was obtained. Prior Anticoagulants: The patient has                            taken no previous anticoagulant or antiplatelet                            agents. ASA Grade Assessment: II - A patient with                            mild systemic disease. After reviewing the risks                            and benefits, the patient was deemed in                            satisfactory condition to undergo the procedure.  After obtaining informed consent, the colonoscope                            was passed under direct vision. Throughout the                            procedure, the patient's blood pressure, pulse, and                            oxygen saturations were monitored continuously. The                            Model PCF-H190DL [id number]) scope was introduced                            through the anus and advanced to  the the cecum,                            identified by appendiceal orifice and ileocecal                            valve. The colonoscopy was performed without                            difficulty. The patient tolerated the procedure                            well. The quality of the bowel preparation was                            excellent. The ileocecal valve, appendiceal                            orifice, and rectum were photographed. Scope In: 10:58:23 AM Scope Out: 11:13:42 AM Scope Withdrawal Time: 0 hours 11 minutes 12 seconds  Total Procedure Duration: 0 hours 15 minutes 19 seconds  Findings:                 The perianal and digital rectal examinations were                            normal.                           A 12 mm polyp was found in the transverse colon.                            The polyp was flat. The polyp was removed with a                            saline injection-lift technique using a hot snare.                            Resection and retrieval were complete.  A 4 mm polyp was found in the transverse colon. The                            polyp was flat. The polyp was removed with a cold                            biopsy forceps. Resection and retrieval were                            complete.                           A few small-mouthed diverticula were found in the                            ascending colon, sigmoid colon and descending colon.                           Non-bleeding internal hemorrhoids were found during                            retroflexion. The hemorrhoids were small.                           The exam was otherwise without abnormality. Complications:            No immediate complications. Estimated Blood Loss:     Estimated blood loss was minimal. Impression:               - One 12 mm polyp in the transverse colon, removed                            using injection-lift and a hot snare. Resected and                             retrieved.                           - One 4 mm polyp in the transverse colon, removed                            with a cold biopsy forceps. Resected and retrieved.                           - Diverticulosis in the ascending colon, sigmoid                            colon and in the descending colon.                           - Non-bleeding internal hemorrhoids.                           - The examination was otherwise normal. Recommendation:           -  Patient has a contact number available for                            emergencies. The signs and symptoms of potential                            delayed complications were discussed with the                            patient. Return to normal activities tomorrow.                            Written discharge instructions were provided to the                            patient.                           - Resume previous diet.                           - Continue present medications.                           - Await pathology results.                           - Repeat colonoscopy in 3 years for surveillance                            based on pathology results.                           - No ibuprofen, naproxen, or other non-steroidal                            anti-inflammatory drugs for 2 weeks. Mauri Pole, MD 01/01/2016 11:21:06 AM This report has been signed electronically.

## 2016-01-01 NOTE — Progress Notes (Signed)
Called to room to assist during endoscopic procedure.  Patient ID and intended procedure confirmed with present staff. Received instructions for my participation in the procedure from the performing physician.  

## 2016-01-01 NOTE — Progress Notes (Signed)
Report given to PACU RN, vss 

## 2016-01-01 NOTE — Patient Instructions (Signed)
YOU HAD AN ENDOSCOPIC PROCEDURE TODAY AT [facility]:   Refer to the procedure report that was given to you for any specific questions about what was found during the examination.  If the procedure report does not answer your questions, please call your gastroenterologist to clarify.  If you requested that your care partner not be given the details of your procedure findings, then the procedure report has been included in a sealed envelope for you to review at your convenience later.  YOU SHOULD EXPECT: Some feelings of bloating in the abdomen. Passage of more gas than usual.  Walking can help get rid of the air that was put into your GI tract during the procedure and reduce the bloating. If you had a lower endoscopy (such as a colonoscopy or flexible sigmoidoscopy) you may notice spotting of blood in your stool or on the toilet paper. If you underwent a bowel prep for your procedure, you may not have a normal bowel movement for a few days.  Please Note:  You might notice some irritation and congestion in your nose or some drainage.  This is from the oxygen used during your procedure.  There is no need for concern and it should clear up in a day or so.  SYMPTOMS TO REPORT IMMEDIATELY:   Following lower endoscopy (colonoscopy or flexible sigmoidoscopy):  Excessive amounts of blood in the stool  Significant tenderness or worsening of abdominal pains  Swelling of the abdomen that is new, acute  Fever of 100F or higher   Following upper endoscopy (EGD)  Vomiting of blood or coffee ground material  New chest pain or pain under the shoulder blades  Painful or persistently difficult swallowing  New shortness of breath  Fever of 100F or higher  Black, tarry-looking stools  For urgent or emergent issues, a gastroenterologist can be reached at any hour by calling [phone].   DIET:  We do recommend a small meal at first, but then you may proceed to your regular diet.  Drink  plenty of fluids but you should avoid alcoholic beverages for 24 hours.  ACTIVITY:  You should plan to take it easy for the rest of today and you should NOT DRIVE or use heavy machinery until tomorrow (because of the sedation medicines used during the test).    FOLLOW UP: Our staff will call the number listed on your records the next business day following your procedure to check on you and address any questions or concerns that you may have regarding the information given to you following your procedure. If we do not reach you, we will leave a message.  However, if you are feeling well and you are not experiencing any problems, there is no need to return our call.  We will assume that you have returned to your regular daily activities without incident.  If any biopsies were taken you will be contacted by phone or by letter within the next 1-3 weeks.  Please call us at [phone] if you have not heard about the biopsies in 3 weeks.    SIGNATURES/CONFIDENTIALITY: You and/or your care partner have signed paperwork which will be entered into your electronic medical record.  These signatures attest to the fact that that the information above on your After Visit Summary has been reviewed and is understood.  Full responsibility of the confidentiality of this discharge information lies with you and/or your care-partner.  Polyp, diverticulosis, high fiber diet and hemorrhoid information given,  No  ibuprofen, naproxen, or other non steroidal anti-inflammatory meds for 2 weeks.

## 2016-01-04 ENCOUNTER — Telehealth: Payer: Self-pay

## 2016-01-04 NOTE — Telephone Encounter (Signed)
  Follow up Call-  Call back number 01/01/2016  Post procedure Call Back phone  # [phone]  Permission to leave phone message Yes  Some recent data might be hidden     Patient questions:  Do you have a fever, pain , or abdominal swelling? No. Pain Score  0 *  Have you tolerated food without any problems? Yes.    Have you been able to return to your normal activities? Yes.    Do you have any questions about your discharge instructions: Diet   No. Medications  No. Follow up visit  No.  Do you have questions or concerns about your Care? No.  Actions: * If pain score is 4 or above: No action needed, pain <4.

## 2016-01-08 ENCOUNTER — Encounter: Payer: Self-pay | Admitting: Family Medicine

## 2016-01-08 ENCOUNTER — Ambulatory Visit: Payer: Medicare Other | Admitting: Family Medicine

## 2016-01-08 DIAGNOSIS — M19032 Primary osteoarthritis, left wrist: Secondary | ICD-10-CM | POA: Diagnosis not present

## 2016-01-08 DIAGNOSIS — T753XXS Motion sickness, sequela: Secondary | ICD-10-CM | POA: Diagnosis not present

## 2016-01-08 DIAGNOSIS — R0781 Pleurodynia: Secondary | ICD-10-CM | POA: Diagnosis not present

## 2016-01-08 MED ORDER — MELOXICAM 15 MG PO TABS: 15.0000 mg | ORAL_TABLET | Freq: Every day | ORAL | 2 refills | Status: DC

## 2016-01-08 MED ORDER — ONDANSETRON HCL 4 MG PO TABS: 4.0000 mg | ORAL_TABLET | Freq: Three times a day (TID) | ORAL | 1 refills | Status: DC | PRN

## 2016-01-09 DIAGNOSIS — M19032 Primary osteoarthritis, left wrist: Secondary | ICD-10-CM | POA: Insufficient documentation

## 2016-01-09 DIAGNOSIS — T753XXA Motion sickness, initial encounter: Secondary | ICD-10-CM | POA: Insufficient documentation

## 2016-01-09 NOTE — Progress Notes (Deleted)
  Ashlee Hamilton - 75 y.o. female MRN [id number]  Date of birth: 1940-10-03    SUBJECTIVE:     Chief Complaint:  1..left wrist pain 2. Right rib pain  HPI: Left wrist has been aching intermittently for several weeks. Right hand dominant. Pain is lateral side, 2-6/10. Aching. No radiation. Not sure if movement makes it necessarily worse. Rest may make it better but it seems randomlyintermittent---hurts some days and some days it does not.  2.1 wk ago.  Bent over a chair awkwardly and impacted her lower right rib cage. Felt something pop and had some sharp pain. Since then has been sore in that area. Some pain if she moves a certain way or deep inspiration. Getting slowly better.  3. Plans extensive travel by airplane, bus train and car in next few weeks. Wants some anti nausea medicine as we have done before.Has problems with motion sickness at times. ROS:     No parasthesias in hands or fingers. No cough. No SOB.  PERTINENT  PMH / PSH FH / / SH:  Past Medical, Surgical, Social, and Family History Reviewed & Updated in the EMR.  Pertinent findings include:  Nonsmoker DEXXA scan 8/17  T score -1.0 (normal) at femur. Bilateral THR  OBJECTIVE: BP 133/86   Pulse 63   Ht 5\' 8"  (1.727 m)   Wt 146 lb (66.2 kg)   BMI 22.20 kg/m   Physical Exam:  Vital signs are reviewed. Gen: WD WN NAD WRIST: left. Prominent ulnar styloid. Nontender. No erythema or unusual warmth.FROM in flexion/extension. Normal radial and ulnar deviation. NEURO: intact sensation to soft touch bilateral hands. RIBS: mildly ttp right anterior front ribs.  RESP: normal inspiratory and expiratory motion. [place]: several osteophytes at lateral wrist joint. Small amount of fluid evident in joint. Small edge of the TFCC appears normal  ASSESSMENT & PLAN: 1. 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 We discussed. Will try 2 weeks of anti-inflammatory (mobic) and see how she does. Could try a CSI if no better. She does not really want to  consider wrist brace 2. Unusual way to fracture a rib. She does not have osteoporosis by her DEXXA exam. I will order CXR to make sure we are not missing something else.. 3. Hx of motion sickness with travel--gave her some ondansetron  For prn use.

## 2016-01-11 ENCOUNTER — Other Ambulatory Visit: Payer: Self-pay | Admitting: *Deleted

## 2016-01-12 ENCOUNTER — Encounter: Payer: Self-pay | Admitting: Gastroenterology

## 2016-01-19 NOTE — Progress Notes (Signed)
This encounter was created in error - please disregard.

## 2016-01-19 NOTE — Addendum Note (Signed)
Addended byDorcas Mcmurray L on: 01/19/2016 03:42 PM   Modules accepted: Orders, SmartSet

## 2016-03-24 ENCOUNTER — Emergency Department (HOSPITAL_COMMUNITY): Payer: Medicare Other

## 2016-03-24 ENCOUNTER — Encounter (HOSPITAL_COMMUNITY): Payer: Self-pay

## 2016-03-24 ENCOUNTER — Observation Stay (HOSPITAL_COMMUNITY)
Admission: EM | Admit: 2016-03-24 | Discharge: 2016-03-26 | Disposition: A | Discharge status: Home / Self Care | Payer: Medicare Other | Attending: Emergency Medicine | Admitting: Emergency Medicine

## 2016-03-24 DIAGNOSIS — S2222XA Fracture of body of sternum, initial encounter for closed fracture: Secondary | ICD-10-CM | POA: Diagnosis not present

## 2016-03-24 DIAGNOSIS — E785 Hyperlipidemia, unspecified: Secondary | ICD-10-CM | POA: Insufficient documentation

## 2016-03-24 DIAGNOSIS — Q7649 Other congenital malformations of spine, not associated with scoliosis: Secondary | ICD-10-CM | POA: Diagnosis not present

## 2016-03-24 DIAGNOSIS — M5382 Other specified dorsopathies, cervical region: Secondary | ICD-10-CM | POA: Insufficient documentation

## 2016-03-24 DIAGNOSIS — J322 Chronic ethmoidal sinusitis: Secondary | ICD-10-CM | POA: Insufficient documentation

## 2016-03-24 DIAGNOSIS — J320 Chronic maxillary sinusitis: Secondary | ICD-10-CM | POA: Insufficient documentation

## 2016-03-24 DIAGNOSIS — M50321 Other cervical disc degeneration at C4-C5 level: Secondary | ICD-10-CM | POA: Diagnosis not present

## 2016-03-24 DIAGNOSIS — M889 Osteitis deformans of unspecified bone: Secondary | ICD-10-CM | POA: Insufficient documentation

## 2016-03-24 DIAGNOSIS — M19041 Primary osteoarthritis, right hand: Secondary | ICD-10-CM | POA: Diagnosis not present

## 2016-03-24 DIAGNOSIS — Y9389 Activity, other specified: Secondary | ICD-10-CM | POA: Diagnosis not present

## 2016-03-24 DIAGNOSIS — N281 Cyst of kidney, acquired: Secondary | ICD-10-CM | POA: Insufficient documentation

## 2016-03-24 DIAGNOSIS — J90 Pleural effusion, not elsewhere classified: Secondary | ICD-10-CM | POA: Diagnosis not present

## 2016-03-24 DIAGNOSIS — R011 Cardiac murmur, unspecified: Secondary | ICD-10-CM | POA: Diagnosis not present

## 2016-03-24 DIAGNOSIS — Y998 Other external cause status: Secondary | ICD-10-CM | POA: Diagnosis not present

## 2016-03-24 DIAGNOSIS — R918 Other nonspecific abnormal finding of lung field: Secondary | ICD-10-CM | POA: Diagnosis not present

## 2016-03-24 DIAGNOSIS — Y9289 Other specified places as the place of occurrence of the external cause: Secondary | ICD-10-CM | POA: Diagnosis not present

## 2016-03-24 DIAGNOSIS — Z90710 Acquired absence of both cervix and uterus: Secondary | ICD-10-CM | POA: Insufficient documentation

## 2016-03-24 DIAGNOSIS — Z87891 Personal history of nicotine dependence: Secondary | ICD-10-CM | POA: Diagnosis not present

## 2016-03-24 DIAGNOSIS — Z9049 Acquired absence of other specified parts of digestive tract: Secondary | ICD-10-CM | POA: Insufficient documentation

## 2016-03-24 DIAGNOSIS — M19032 Primary osteoarthritis, left wrist: Secondary | ICD-10-CM | POA: Insufficient documentation

## 2016-03-24 DIAGNOSIS — M19042 Primary osteoarthritis, left hand: Secondary | ICD-10-CM | POA: Diagnosis not present

## 2016-03-24 DIAGNOSIS — I700 Atherosclerosis of aorta: Secondary | ICD-10-CM | POA: Insufficient documentation

## 2016-03-24 DIAGNOSIS — S2220XA Unspecified fracture of sternum, initial encounter for closed fracture: Secondary | ICD-10-CM

## 2016-03-24 LAB — CBC WITH DIFFERENTIAL/PLATELET
BASOS PCT: 0 %
Basophils Absolute: 0 10*3/uL (ref 0.0–0.1)
Eosinophils Absolute: 0.1 10*3/uL (ref 0.0–0.7)
Eosinophils Relative: 1 %
HEMATOCRIT: 42.8 % (ref 36.0–46.0)
HEMOGLOBIN: 14.3 g/dL (ref 12.0–15.0)
LYMPHS ABS: 1.4 10*3/uL (ref 0.7–4.0)
LYMPHS PCT: 20 %
MCH: 30.2 pg (ref 26.0–34.0)
MCHC: 33.4 g/dL (ref 30.0–36.0)
MCV: 90.5 fL (ref 78.0–100.0)
MONO ABS: 0.5 10*3/uL (ref 0.1–1.0)
MONOS PCT: 7 %
NEUTROS ABS: 5 10*3/uL (ref 1.7–7.7)
NEUTROS PCT: 72 %
Platelets: 169 10*3/uL (ref 150–400)
RBC: 4.73 MIL/uL (ref 3.87–5.11)
RDW: 13.3 % (ref 11.5–15.5)
WBC: 7 10*3/uL (ref 4.0–10.5)

## 2016-03-24 LAB — COMPREHENSIVE METABOLIC PANEL
ALT: 24 U/L (ref 14–54)
AST: 39 U/L (ref 15–41)
Albumin: 4.4 g/dL (ref 3.5–5.0)
Alkaline Phosphatase: 78 U/L (ref 38–126)
Anion gap: 9 (ref 5–15)
BILIRUBIN TOTAL: 0.7 mg/dL (ref 0.3–1.2)
BUN: 5 mg/dL — AB (ref 6–20)
CO2: 28 mmol/L (ref 22–32)
CREATININE: 0.72 mg/dL (ref 0.44–1.00)
Calcium: 10.1 mg/dL (ref 8.9–10.3)
Chloride: 101 mmol/L (ref 101–111)
Glucose, Bld: 109 mg/dL — ABNORMAL HIGH (ref 65–99)
Potassium: 3.8 mmol/L (ref 3.5–5.1)
Sodium: 138 mmol/L (ref 135–145)
TOTAL PROTEIN: 7.5 g/dL (ref 6.5–8.1)

## 2016-03-24 LAB — I-STAT TROPONIN, ED: Troponin i, poc: 0 ng/mL (ref 0.00–0.08)

## 2016-03-24 LAB — I-STAT CHEM 8, ED
BUN: 5 mg/dL — AB (ref 6–20)
CREATININE: 0.7 mg/dL (ref 0.44–1.00)
Calcium, Ion: 1.17 mmol/L (ref 1.15–1.40)
Chloride: 101 mmol/L (ref 101–111)
GLUCOSE: 108 mg/dL — AB (ref 65–99)
HEMATOCRIT: 42 % (ref 36.0–46.0)
Hemoglobin: 14.3 g/dL (ref 12.0–15.0)
POTASSIUM: 3.7 mmol/L (ref 3.5–5.1)
Sodium: 140 mmol/L (ref 135–145)
TCO2: 27 mmol/L (ref 0–100)

## 2016-03-24 LAB — URINALYSIS, ROUTINE W REFLEX MICROSCOPIC
BACTERIA UA: NONE SEEN
Bilirubin Urine: NEGATIVE
Glucose, UA: NEGATIVE mg/dL
Ketones, ur: NEGATIVE mg/dL
Leukocytes, UA: NEGATIVE
Nitrite: NEGATIVE
PH: 6 (ref 5.0–8.0)
Protein, ur: NEGATIVE mg/dL
SQUAMOUS EPITHELIAL / LPF: NONE SEEN
Specific Gravity, Urine: 1.005 (ref 1.005–1.030)

## 2016-03-24 LAB — I-STAT CG4 LACTIC ACID, ED: Lactic Acid, Venous: 1.22 mmol/L (ref 0.5–1.9)

## 2016-03-24 MED ORDER — IOPAMIDOL (ISOVUE-300) INJECTION 61%
INTRAVENOUS | Status: AC
  Administered 2016-03-24: 100 mL
  Filled 2016-03-24: qty 100

## 2016-03-24 MED ORDER — NAPROXEN 250 MG PO TABS
500.0000 mg | ORAL_TABLET | Freq: Two times a day (BID) | ORAL | Status: DC
  Administered 2016-03-25 – 2016-03-26 (×3): 500 mg via ORAL
  Filled 2016-03-24 (×3): qty 2

## 2016-03-24 MED ORDER — ONDANSETRON HCL 4 MG/2ML IJ SOLN: 4.0000 mg | Freq: Four times a day (QID) | INTRAMUSCULAR | Status: DC | PRN

## 2016-03-24 MED ORDER — ONDANSETRON HCL 4 MG/2ML IJ SOLN
4.0000 mg | Freq: Once | INTRAMUSCULAR | Status: AC
  Administered 2016-03-24: 4 mg via INTRAVENOUS
  Filled 2016-03-24: qty 2

## 2016-03-24 MED ORDER — ONDANSETRON HCL 4 MG PO TABS: 4.0000 mg | ORAL_TABLET | Freq: Four times a day (QID) | ORAL | Status: DC | PRN

## 2016-03-24 MED ORDER — SODIUM CHLORIDE 0.9 % IV SOLN
INTRAVENOUS | Status: DC
  Administered 2016-03-24: 13:00:00 via INTRAVENOUS

## 2016-03-24 MED ORDER — DOCUSATE SODIUM 100 MG PO CAPS
100.0000 mg | ORAL_CAPSULE | Freq: Two times a day (BID) | ORAL | Status: DC
  Administered 2016-03-25 – 2016-03-26 (×2): 100 mg via ORAL
  Filled 2016-03-24 (×2): qty 1

## 2016-03-24 MED ORDER — ENOXAPARIN SODIUM 40 MG/0.4ML ~~LOC~~ SOLN
40.0000 mg | SUBCUTANEOUS | Status: DC
  Administered 2016-03-24 – 2016-03-25 (×2): 40 mg via SUBCUTANEOUS
  Filled 2016-03-24 (×2): qty 0.4

## 2016-03-24 MED ORDER — POLYETHYLENE GLYCOL 3350 17 G PO PACK
17.0000 g | PACK | Freq: Every day | ORAL | Status: DC
  Administered 2016-03-26: 17 g via ORAL
  Filled 2016-03-24: qty 1

## 2016-03-24 MED ORDER — MORPHINE SULFATE (PF) 4 MG/ML IV SOLN
4.0000 mg | Freq: Once | INTRAVENOUS | Status: AC
  Administered 2016-03-24: 4 mg via INTRAVENOUS
  Filled 2016-03-24: qty 1

## 2016-03-24 MED ORDER — MORPHINE SULFATE (PF) 2 MG/ML IV SOLN: 2.0000 mg | INTRAVENOUS | Status: DC | PRN

## 2016-03-24 MED ORDER — IOPAMIDOL (ISOVUE-300) INJECTION 61%
INTRAVENOUS | Status: AC
  Filled 2016-03-24: qty 100

## 2016-03-24 MED ORDER — TRAMADOL HCL 50 MG PO TABS
50.0000 mg | ORAL_TABLET | Freq: Four times a day (QID) | ORAL | Status: DC | PRN
  Administered 2016-03-24 – 2016-03-26 (×5): 100 mg via ORAL
  Filled 2016-03-24 (×5): qty 2
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
## 2016-03-24 NOTE — Progress Notes (Signed)
CSW met with pt to discuss care for her husband who has Alzheimer's dementia.  Pt explains that she/husband have no children and few supports in the area. At this time, pt's husband is in the care of pt's sister.  Pt/her sister have been trying to reach Mr. Stanislawski brothers who may be able to help care for him during pt's hospitalization.  Discussed private duty sitters and ALF respite care with pt.  Pt unsure what they can afford at this time.  Private duty Advertising copywriter provided.  Pt tearful during interview.  CSW offered emotional support.
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
## 2016-03-24 NOTE — ED Notes (Signed)
Patient stable to be taken to floor at this point.  Report was given to the floor 6N.  Belongings taken to the floor with the patient.
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
## 2016-03-24 NOTE — ED Notes (Signed)
Patient placed on 2L O2 to assist with breathing

## 2016-03-25 ENCOUNTER — Observation Stay (HOSPITAL_COMMUNITY): Payer: Medicare Other

## 2016-03-25 DIAGNOSIS — S2222XA Fracture of body of sternum, initial encounter for closed fracture: Secondary | ICD-10-CM | POA: Diagnosis not present

## 2016-03-25 LAB — BASIC METABOLIC PANEL
Anion gap: 8 (ref 5–15)
BUN: 6 mg/dL (ref 6–20)
CALCIUM: 9 mg/dL (ref 8.9–10.3)
CO2: 28 mmol/L (ref 22–32)
CREATININE: 0.69 mg/dL (ref 0.44–1.00)
Chloride: 103 mmol/L (ref 101–111)
GFR calc non Af Amer: >60 mL/min (ref >60)
GLUCOSE: 112 mg/dL — AB (ref 65–99)
Potassium: 3.6 mmol/L (ref 3.5–5.1)
Sodium: 139 mmol/L (ref 135–145)

## 2016-03-25 LAB — CBC
HEMATOCRIT: 38.5 % (ref 36.0–46.0)
Hemoglobin: 12.4 g/dL (ref 12.0–15.0)
MCH: 29.3 pg (ref 26.0–34.0)
MCHC: 32.2 g/dL (ref 30.0–36.0)
MCV: 91 fL (ref 78.0–100.0)
Platelets: 149 10*3/uL — ABNORMAL LOW (ref 150–400)
RBC: 4.23 MIL/uL (ref 3.87–5.11)
RDW: 13.6 % (ref 11.5–15.5)
WBC: 7.4 10*3/uL (ref 4.0–10.5)
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
## 2016-03-25 NOTE — Progress Notes (Signed)
SATURATION QUALIFICATIONS: (This note is used to comply with regulatory documentation for home oxygen)  Patient Saturations on Room Air at Rest = 92%  Patient Saturations on Room Air while Ambulating = 87%  Patient Saturations on 1 Liters of oxygen while Ambulating = 92%  Please briefly explain why patient needs home oxygen: Pt desats without O2 while ambulating.   Jacoya Cressy, SPT [phone]
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
## 2016-03-25 NOTE — Progress Notes (Signed)
OT Cancellation Note  Patient Details Name: Ashlee Hamilton MRN: [id number] DOB: June 04, 1940   Cancelled Treatment:    Reason Eval/Treat Not Completed: Pain limiting ability to participate. Pt tearful and in intense pain on OT arrival. Had just moved off of bed pan and received pain medication. OT will check back for evaluation as able.  [address] OTR/L [id number] 03/25/2016, 9:05 AM

## 2016-03-26 DIAGNOSIS — S2222XA Fracture of body of sternum, initial encounter for closed fracture: Secondary | ICD-10-CM | POA: Diagnosis not present

## 2016-03-26 MED ORDER — TRAMADOL HCL 50 MG PO TABS: 50.0000 mg | ORAL_TABLET | Freq: Four times a day (QID) | ORAL | 0 refills | Status: DC | PRN
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
## 2016-03-26 NOTE — Progress Notes (Addendum)
Patient discharged to home with instructions, prescription and equipments (3 in 1 and walker)

## 2016-03-26 NOTE — Progress Notes (Signed)
Late entry for missed g code:   Apr 24, 2016 1400  PT G-Codes **NOT FOR INPATIENT CLASS**  Functional Assessment Tool Used clinical judgment  Functional Limitation Mobility: Walking and moving around  Mobility: Walking and Moving Around Current Status [phone]) CI  Mobility: Walking and Moving Around Goal Status [phone]) CI  [facility] [phone] (pager)
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
## 2016-03-26 NOTE — Care Management Obs Status (Signed)
[facility] NOTIFICATION   Patient Details  Name: Ashlee Hamilton MRN: [id number] Date of Birth: Dec 06, 1940   Medicare Observation Status Notification Given:  Yes    CrutchfieldAntony Haste, RN 03/26/2016, 2:22 PM

## 2016-03-26 NOTE — Care Management Note (Signed)
Case Management Note  Patient Details  Name: Ashlee Hamilton MRN: [id number] Date of Birth: February 02, 1941  Subjective/Objective:  Spoke with pt at length abouth discharge options, assistance at home, [employer] and options offered by CSW. Pt tearful as she talked about care of spouse and lack of caregivers available to her. Notified Jermaine with [facility] and HHPT needs if/when MD writes discharge. DME ordered as well. No further CM needs at present.                   Action/Plan: Anticipate discharge home today. No further CM needs but will be available should additional discharge needs arise.   Expected Discharge Date:                  Expected Discharge Plan:  [place]  In-House Referral:  Clinical Social Work  Discharge planning Services  CM Consult  Post Acute Care Choice:  Durable Medical Equipment, Home Health Choice offered to:  Patient  DME Arranged:  Walker rolling DME Agency:  [facility] Arranged:  PT, Nurse's Aide [place] Agency:  [place]  Status of Service:  Completed, signed off  If discussed at [place] of Stay Meetings, dates discussed:    Additional Comments:  Delrae Sawyers, RN 03/26/2016, 2:48 PM
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
## 2016-11-10 ENCOUNTER — Other Ambulatory Visit: Payer: Self-pay | Admitting: Geriatric Medicine

## 2016-11-10 DIAGNOSIS — R911 Solitary pulmonary nodule: Secondary | ICD-10-CM

## 2016-11-10 DIAGNOSIS — Z1231 Encounter for screening mammogram for malignant neoplasm of breast: Secondary | ICD-10-CM

## 2016-11-15 ENCOUNTER — Ambulatory Visit
Admission: RE | Admit: 2016-11-15 | Discharge: 2016-11-15 | Disposition: A | Discharge status: Home / Self Care | Payer: Medicare Other | Source: Ambulatory Visit | Attending: Geriatric Medicine | Admitting: Geriatric Medicine

## 2016-11-15 DIAGNOSIS — R911 Solitary pulmonary nodule: Secondary | ICD-10-CM

## 2016-11-25 ENCOUNTER — Ambulatory Visit
Admission: RE | Admit: 2016-11-25 | Discharge: 2016-11-25 | Disposition: A | Discharge status: Home / Self Care | Payer: Medicare Other | Source: Ambulatory Visit | Attending: Geriatric Medicine | Admitting: Geriatric Medicine

## 2016-11-25 DIAGNOSIS — Z1231 Encounter for screening mammogram for malignant neoplasm of breast: Secondary | ICD-10-CM

## 2017-10-24 ENCOUNTER — Other Ambulatory Visit: Payer: Self-pay | Admitting: Geriatric Medicine

## 2017-10-24 DIAGNOSIS — Z1231 Encounter for screening mammogram for malignant neoplasm of breast: Secondary | ICD-10-CM

## 2017-11-17 ENCOUNTER — Other Ambulatory Visit: Payer: Self-pay | Admitting: Geriatric Medicine

## 2017-11-17 DIAGNOSIS — R911 Solitary pulmonary nodule: Secondary | ICD-10-CM

## 2017-11-28 ENCOUNTER — Ambulatory Visit
Admission: RE | Admit: 2017-11-28 | Discharge: 2017-11-28 | Disposition: A | Discharge status: Home / Self Care | Payer: Medicare Other | Source: Ambulatory Visit | Attending: Geriatric Medicine | Admitting: Geriatric Medicine

## 2017-11-28 ENCOUNTER — Ambulatory Visit: Payer: Medicare Other

## 2017-11-28 DIAGNOSIS — Z1231 Encounter for screening mammogram for malignant neoplasm of breast: Secondary | ICD-10-CM

## 2017-11-29 ENCOUNTER — Ambulatory Visit
Admission: RE | Admit: 2017-11-29 | Discharge: 2017-11-29 | Disposition: A | Discharge status: Home / Self Care | Payer: Medicare Other | Source: Ambulatory Visit | Attending: Geriatric Medicine | Admitting: Geriatric Medicine

## 2017-11-29 DIAGNOSIS — R911 Solitary pulmonary nodule: Secondary | ICD-10-CM
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
## 2018-10-18 ENCOUNTER — Other Ambulatory Visit: Payer: Self-pay | Admitting: Geriatric Medicine

## 2018-10-18 DIAGNOSIS — Z1231 Encounter for screening mammogram for malignant neoplasm of breast: Secondary | ICD-10-CM

## 2018-11-24 ENCOUNTER — Encounter: Payer: Self-pay | Admitting: Gastroenterology

## 2018-12-03 ENCOUNTER — Encounter: Payer: Self-pay | Admitting: Gastroenterology

## 2018-12-04 ENCOUNTER — Ambulatory Visit
Admission: RE | Admit: 2018-12-04 | Discharge: 2018-12-04 | Disposition: A | Discharge status: Home / Self Care | Payer: Medicare Other | Source: Ambulatory Visit | Attending: Geriatric Medicine | Admitting: Geriatric Medicine

## 2018-12-04 ENCOUNTER — Other Ambulatory Visit: Payer: Self-pay

## 2018-12-04 DIAGNOSIS — Z1231 Encounter for screening mammogram for malignant neoplasm of breast: Secondary | ICD-10-CM

## 2018-12-21 ENCOUNTER — Encounter: Payer: Self-pay | Admitting: Gastroenterology

## 2018-12-21 ENCOUNTER — Ambulatory Visit (AMBULATORY_SURGERY_CENTER): Payer: Self-pay

## 2018-12-21 ENCOUNTER — Other Ambulatory Visit: Payer: Self-pay

## 2018-12-21 VITALS — Temp 96.9°F | Ht 68.0 in | Wt 199.6 lb

## 2018-12-21 DIAGNOSIS — Z86010 Personal history of colonic polyps: Secondary | ICD-10-CM

## 2018-12-21 MED ORDER — NA SULFATE-K SULFATE-MG SULF 17.5-3.13-1.6 GM/177ML PO SOLN: 1.0000 | Freq: Once | ORAL | 0 refills | Status: AC

## 2018-12-21 NOTE — Progress Notes (Signed)
Denies allergies to eggs or soy products. Denies complication of anesthesia or sedation. Denies use of weight loss medication. Denies use of O2.   Emmi instructions given for colonoscopy.  

## 2019-01-02 ENCOUNTER — Telehealth: Payer: Self-pay | Admitting: Gastroenterology
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
## 2019-01-02 NOTE — Telephone Encounter (Signed)
The mask is removed during the procedure when she is sedated and its put back on once procedure is completed.

## 2019-01-03 ENCOUNTER — Telehealth: Payer: Self-pay

## 2019-01-03 NOTE — Telephone Encounter (Signed)
Covid-19 screening questions   Do you now or have you had a fever in the last 14 days?  Do you have any respiratory symptoms of shortness of breath or cough now or in the last 14 days?  Do you have any family members or close contacts with diagnosed or suspected Covid-19 in the past 14 days?  Have you been tested for Covid-19 and found to be positive?       

## 2019-01-03 NOTE — Telephone Encounter (Signed)
Pt responded "no" to all screening questions °

## 2019-01-04 ENCOUNTER — Ambulatory Visit (AMBULATORY_SURGERY_CENTER): Payer: Medicare Other | Admitting: Gastroenterology

## 2019-01-04 ENCOUNTER — Encounter: Payer: Self-pay | Admitting: Gastroenterology

## 2019-01-04 ENCOUNTER — Other Ambulatory Visit: Payer: Self-pay

## 2019-01-04 VITALS — BP 158/72 | HR 65 | Temp 98.5°F | Resp 15 | Ht 68.0 in | Wt 199.6 lb

## 2019-01-04 DIAGNOSIS — D123 Benign neoplasm of transverse colon: Secondary | ICD-10-CM | POA: Diagnosis not present

## 2019-01-04 DIAGNOSIS — Z86010 Personal history of colonic polyps: Secondary | ICD-10-CM | POA: Diagnosis not present

## 2019-01-04 DIAGNOSIS — D120 Benign neoplasm of cecum: Secondary | ICD-10-CM

## 2019-01-04 DIAGNOSIS — D125 Benign neoplasm of sigmoid colon: Secondary | ICD-10-CM | POA: Diagnosis not present

## 2019-01-04 MED ORDER — SODIUM CHLORIDE 0.9 % IV SOLN: 500.0000 mL | INTRAVENOUS | Status: AC

## 2019-01-04 NOTE — Progress Notes (Signed)
[facility] temps/Nancy VS/Sherron Mapp checked in/Donna IV.

## 2019-01-04 NOTE — Op Note (Signed)
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 The patient's tolerance of                            previous anesthesia was also reviewed. The risks                            and benefits of the procedure and the sedation                            options and risks were discussed with the patient.                            All questions were answered, and informed consent                            was obtained. Prior Anticoagulants: The patient has                            taken no previous anticoagulant or antiplatelet                            agents. ASA Grade Assessment: II - A patient with                            mild systemic disease. After reviewing the risks                            and benefits, the patient was deemed in                            satisfactory condition to undergo the procedure.  After obtaining informed consent, the colonoscope                            was passed under direct vision. Throughout the                            procedure, the patient's blood pressure, pulse, and                            oxygen saturations were monitored continuously. The                            Colonoscope was introduced through the anus and                            advanced to  the the cecum, identified by                            appendiceal orifice and ileocecal valve. The                            colonoscopy was performed without difficulty. The                            patient tolerated the procedure well. The quality                            of the bowel preparation was excellent. The                            ileocecal valve, appendiceal orifice, and rectum                            were photographed. Scope In: 10:32:22 AM Scope Out: 10:47:44 AM Scope Withdrawal Time: 0 hours 10 minutes 36 seconds  Total Procedure Duration: 0 hours 15 minutes 22 seconds  Findings:                 The perianal and digital rectal examinations were                            normal.                           A 7 mm polyp was found in the transverse colon. The                            polyp was sessile. The polyp was removed with a                            cold snare. Resection and retrieval were complete.                           Three sessile polyps were found in the sigmoid  colon, transverse colon and cecum. The polyps were                            1 to 2 mm in size. These polyps were removed with a                            cold biopsy forceps. Resection and retrieval were                            complete.                           Scattered small-mouthed diverticula were found in                            the sigmoid colon, descending colon and ascending                            colon.                           Non-bleeding internal hemorrhoids were found during                            retroflexion. The hemorrhoids were small. Complications:            No immediate complications. Estimated Blood Loss:     Estimated blood loss was minimal. Impression:               - One 7 mm polyp in the transverse colon, removed                            with a cold snare. Resected and retrieved.                           - Three 1  to 2 mm polyps in the sigmoid colon, in                            the transverse colon and in the cecum, removed with                            a cold biopsy forceps. Resected and retrieved.                           - Mild diverticulosis in the sigmoid colon, in the                            descending colon and in the ascending colon.                           - Non-bleeding internal hemorrhoids. Recommendation:           - Patient has a contact number available for  emergencies. The signs and symptoms of potential                            delayed complications were discussed with the                            patient. Return to normal activities tomorrow.                            Written discharge instructions were provided to the                            patient.                           - Resume previous diet.                           - Continue present medications.                           - Await pathology results.                           - Repeat colonoscopy in 3 - 5 years for                            surveillance based on pathology results. Mauri Pole, MD 01/04/2019 10:52:36 AM This report has been signed electronically.

## 2019-01-04 NOTE — Patient Instructions (Signed)
YOU HAD AN ENDOSCOPIC PROCEDURE TODAY AT THE [facility]:   Refer to the procedure report that was given to you for any specific questions about what was found during the examination.  If the procedure report does not answer your questions, please call your gastroenterologist to clarify.  If you requested that your care partner not be given the details of your procedure findings, then the procedure report has been included in a sealed envelope for you to review at your convenience later.  YOU SHOULD EXPECT: Some feelings of bloating in the abdomen. Passage of more gas than usual.  Walking can help get rid of the air that was put into your GI tract during the procedure and reduce the bloating. If you had a lower endoscopy (such as a colonoscopy or flexible sigmoidoscopy) you may notice spotting of blood in your stool or on the toilet paper. If you underwent a bowel prep for your procedure, you may not have a normal bowel movement for a few days.  Please Note:  You might notice some irritation and congestion in your nose or some drainage.  This is from the oxygen used during your procedure.  There is no need for concern and it should clear up in a day or so.  SYMPTOMS TO REPORT IMMEDIATELY:   Following lower endoscopy (colonoscopy or flexible sigmoidoscopy):  Excessive amounts of blood in the stool  Significant tenderness or worsening of abdominal pains  Swelling of the abdomen that is new, acute  Fever of 100F or higher   For urgent or emergent issues, a gastroenterologist can be reached at any hour by calling [phone].   DIET:  We do recommend a small meal at first, but then you may proceed to your regular diet.  Drink plenty of fluids but you should avoid alcoholic beverages for 24 hours.  MEDICATIONS: Continue present medications.  Please see handouts given to you by your recovery nurse.  ACTIVITY:  You should plan to take it easy for the rest of today and you should  NOT DRIVE or use heavy machinery until tomorrow (because of the sedation medicines used during the test).    FOLLOW UP: Our staff will call the number listed on your records 48-72 hours following your procedure to check on you and address any questions or concerns that you may have regarding the information given to you following your procedure. If we do not reach you, we will leave a message.  We will attempt to reach you two times.  During this call, we will ask if you have developed any symptoms of COVID 19. If you develop any symptoms (ie: fever, flu-like symptoms, shortness of breath, cough etc.) before then, please call [phone].  If you test positive for Covid 19 in the 2 weeks post procedure, please call and report this information to us.    If any biopsies were taken you will be contacted by phone or by letter within the next 1-3 weeks.  Please call us at [phone] if you have not heard about the biopsies in 3 weeks.   Thank you for allowing us to provide for your healthcare needs today.   SIGNATURES/CONFIDENTIALITY: You and/or your care partner have signed paperwork which will be entered into your electronic medical record.  These signatures attest to the fact that that the information above on your After Visit Summary has been reviewed and is understood.  Full responsibility of the confidentiality of this discharge information lies with you and/or   your care-partner. 

## 2019-01-08 ENCOUNTER — Telehealth: Payer: Self-pay

## 2019-01-08 NOTE — Telephone Encounter (Signed)
First attempt to follow up with pt, LM on VM

## 2019-01-08 NOTE — Telephone Encounter (Signed)
  Follow up Call-  Call back number 01/04/2019  Post procedure Call Back phone  # [phone]  Permission to leave phone message Yes  Some recent data might be hidden     Patient questions:  Do you have a fever, pain , or abdominal swelling? No. Pain Score  0 *  Have you tolerated food without any problems? Yes.    Have you been able to return to your normal activities? Yes.    Do you have any questions about your discharge instructions: Diet   No. Medications  No. Follow up visit  No.  Do you have questions or concerns about your Care? No.  Actions: * If pain score is 4 or above: No action needed, pain <4.  1. Have you developed a fever since your procedure? no  2.   Have you had an respiratory symptoms (SOB or cough) since your procedure? no  3.   Have you tested positive for COVID 19 since your procedure no  4.   Have you had any family members/close contacts diagnosed with the COVID 19 since your procedure?  no   If yes to any of these questions please route to Joylene John, RN and Alphonsa Gin, Therapist, sports.

## 2019-01-11 ENCOUNTER — Encounter: Payer: Self-pay | Admitting: Gastroenterology

## 2019-08-20 ENCOUNTER — Other Ambulatory Visit: Payer: Self-pay | Admitting: Geriatric Medicine

## 2019-08-20 DIAGNOSIS — Z1231 Encounter for screening mammogram for malignant neoplasm of breast: Secondary | ICD-10-CM

## 2019-10-24 ENCOUNTER — Emergency Department (HOSPITAL_COMMUNITY)
Admission: EM | Admit: 2019-10-24 | Discharge: 2019-10-24 | Disposition: A | Discharge status: Home / Self Care | Payer: Medicare Other | Attending: Emergency Medicine | Admitting: Emergency Medicine

## 2019-10-24 ENCOUNTER — Encounter (HOSPITAL_COMMUNITY): Payer: Self-pay

## 2019-10-24 DIAGNOSIS — Z203 Contact with and (suspected) exposure to rabies: Secondary | ICD-10-CM | POA: Diagnosis not present

## 2019-10-24 DIAGNOSIS — M25562 Pain in left knee: Secondary | ICD-10-CM | POA: Insufficient documentation

## 2019-10-24 DIAGNOSIS — E669 Obesity, unspecified: Secondary | ICD-10-CM | POA: Diagnosis not present

## 2019-10-24 DIAGNOSIS — Z2914 Encounter for prophylactic rabies immune globin: Secondary | ICD-10-CM | POA: Diagnosis not present

## 2019-10-24 DIAGNOSIS — Z23 Encounter for immunization: Secondary | ICD-10-CM | POA: Insufficient documentation

## 2019-10-24 DIAGNOSIS — F17210 Nicotine dependence, cigarettes, uncomplicated: Secondary | ICD-10-CM | POA: Diagnosis not present

## 2019-10-24 MED ORDER — RABIES VACCINE, PCEC IM SUSR
1.0000 mL | Freq: Once | INTRAMUSCULAR | Status: AC
  Administered 2019-10-24: 1 mL via INTRAMUSCULAR
  Filled 2019-10-24: qty 1

## 2019-10-24 MED ORDER — RABIES IMMUNE GLOBULIN 150 UNIT/ML IM INJ
20.0000 [IU]/kg | INJECTION | Freq: Once | INTRAMUSCULAR | Status: AC
  Administered 2019-10-24: 1800 [IU]
  Filled 2019-10-24: qty 12
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
## 2019-10-24 NOTE — Discharge Instructions (Addendum)
If the bat tested negative for rabies you do not need to continue the vaccine series.  However if the bat test positive you need to get 3 more injections.  They will be on day 3 (July 25), day 7 (July 29), and day 14 (Aug 5)
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
## 2019-12-05 ENCOUNTER — Ambulatory Visit: Payer: Medicare Other

## 2019-12-05 ENCOUNTER — Ambulatory Visit
Admission: RE | Admit: 2019-12-05 | Discharge: 2019-12-05 | Disposition: A | Discharge status: Home / Self Care | Payer: Medicare Other | Source: Ambulatory Visit | Attending: Geriatric Medicine | Admitting: Geriatric Medicine

## 2019-12-05 ENCOUNTER — Other Ambulatory Visit: Payer: Self-pay

## 2019-12-05 DIAGNOSIS — Z1231 Encounter for screening mammogram for malignant neoplasm of breast: Secondary | ICD-10-CM

## 2019-12-11 ENCOUNTER — Other Ambulatory Visit: Payer: Self-pay | Admitting: Geriatric Medicine

## 2019-12-11 DIAGNOSIS — R928 Other abnormal and inconclusive findings on diagnostic imaging of breast: Secondary | ICD-10-CM

## 2019-12-19 ENCOUNTER — Ambulatory Visit
Admission: RE | Admit: 2019-12-19 | Discharge: 2019-12-19 | Disposition: A | Discharge status: Home / Self Care | Payer: Medicare Other | Source: Ambulatory Visit | Attending: Geriatric Medicine | Admitting: Geriatric Medicine

## 2019-12-19 ENCOUNTER — Other Ambulatory Visit: Payer: Self-pay | Admitting: Geriatric Medicine

## 2019-12-19 ENCOUNTER — Other Ambulatory Visit: Payer: Self-pay

## 2019-12-19 DIAGNOSIS — N6310 Unspecified lump in the right breast, unspecified quadrant: Secondary | ICD-10-CM

## 2019-12-19 DIAGNOSIS — R928 Other abnormal and inconclusive findings on diagnostic imaging of breast: Secondary | ICD-10-CM

## 2020-06-05 DIAGNOSIS — I10 Essential (primary) hypertension: Secondary | ICD-10-CM | POA: Diagnosis not present

## 2020-06-05 DIAGNOSIS — Z79899 Other long term (current) drug therapy: Secondary | ICD-10-CM | POA: Diagnosis not present

## 2020-06-12 DIAGNOSIS — H26491 Other secondary cataract, right eye: Secondary | ICD-10-CM | POA: Diagnosis not present

## 2020-06-12 DIAGNOSIS — H35371 Puckering of macula, right eye: Secondary | ICD-10-CM | POA: Diagnosis not present

## 2020-06-12 DIAGNOSIS — H348312 Tributary (branch) retinal vein occlusion, right eye, stable: Secondary | ICD-10-CM | POA: Diagnosis not present

## 2020-06-12 DIAGNOSIS — H35341 Macular cyst, hole, or pseudohole, right eye: Secondary | ICD-10-CM | POA: Diagnosis not present

## 2020-06-17 DIAGNOSIS — H43391 Other vitreous opacities, right eye: Secondary | ICD-10-CM | POA: Diagnosis not present

## 2020-06-17 DIAGNOSIS — H348312 Tributary (branch) retinal vein occlusion, right eye, stable: Secondary | ICD-10-CM | POA: Diagnosis not present

## 2020-06-17 DIAGNOSIS — H35371 Puckering of macula, right eye: Secondary | ICD-10-CM | POA: Diagnosis not present

## 2020-06-18 ENCOUNTER — Ambulatory Visit
Admission: RE | Admit: 2020-06-18 | Discharge: 2020-06-18 | Disposition: A | Discharge status: Home / Self Care | Payer: Medicare Other | Source: Ambulatory Visit | Attending: Geriatric Medicine | Admitting: Geriatric Medicine

## 2020-06-18 ENCOUNTER — Other Ambulatory Visit: Payer: Self-pay

## 2020-06-18 ENCOUNTER — Other Ambulatory Visit: Payer: Self-pay | Admitting: Geriatric Medicine

## 2020-06-18 DIAGNOSIS — N6001 Solitary cyst of right breast: Secondary | ICD-10-CM | POA: Diagnosis not present

## 2020-06-18 DIAGNOSIS — N6310 Unspecified lump in the right breast, unspecified quadrant: Secondary | ICD-10-CM

## 2020-09-23 DIAGNOSIS — H5203 Hypermetropia, bilateral: Secondary | ICD-10-CM | POA: Diagnosis not present

## 2020-09-23 DIAGNOSIS — H25013 Cortical age-related cataract, bilateral: Secondary | ICD-10-CM | POA: Diagnosis not present

## 2020-09-30 DIAGNOSIS — H35371 Puckering of macula, right eye: Secondary | ICD-10-CM | POA: Diagnosis not present

## 2020-09-30 DIAGNOSIS — H348312 Tributary (branch) retinal vein occlusion, right eye, stable: Secondary | ICD-10-CM | POA: Diagnosis not present

## 2020-09-30 DIAGNOSIS — H43391 Other vitreous opacities, right eye: Secondary | ICD-10-CM | POA: Diagnosis not present

## 2020-12-22 ENCOUNTER — Ambulatory Visit
Admission: RE | Admit: 2020-12-22 | Discharge: 2020-12-22 | Disposition: A | Discharge status: Home / Self Care | Payer: Medicare Other | Source: Ambulatory Visit | Attending: Geriatric Medicine | Admitting: Geriatric Medicine

## 2020-12-22 ENCOUNTER — Other Ambulatory Visit: Payer: Self-pay

## 2020-12-22 DIAGNOSIS — N6310 Unspecified lump in the right breast, unspecified quadrant: Secondary | ICD-10-CM

## 2020-12-22 DIAGNOSIS — R922 Inconclusive mammogram: Secondary | ICD-10-CM | POA: Diagnosis not present

## 2020-12-23 DIAGNOSIS — H35371 Puckering of macula, right eye: Secondary | ICD-10-CM | POA: Diagnosis not present

## 2020-12-23 DIAGNOSIS — H43391 Other vitreous opacities, right eye: Secondary | ICD-10-CM | POA: Diagnosis not present

## 2020-12-23 DIAGNOSIS — H34831 Tributary (branch) retinal vein occlusion, right eye, with macular edema: Secondary | ICD-10-CM | POA: Diagnosis not present

## 2021-01-19 DIAGNOSIS — M889 Osteitis deformans of unspecified bone: Secondary | ICD-10-CM | POA: Diagnosis not present

## 2021-01-19 DIAGNOSIS — E7800 Pure hypercholesterolemia, unspecified: Secondary | ICD-10-CM | POA: Diagnosis not present

## 2021-01-19 DIAGNOSIS — I700 Atherosclerosis of aorta: Secondary | ICD-10-CM | POA: Diagnosis not present

## 2021-01-19 DIAGNOSIS — Z1389 Encounter for screening for other disorder: Secondary | ICD-10-CM | POA: Diagnosis not present

## 2021-01-19 DIAGNOSIS — K219 Gastro-esophageal reflux disease without esophagitis: Secondary | ICD-10-CM | POA: Diagnosis not present

## 2021-01-19 DIAGNOSIS — G4730 Sleep apnea, unspecified: Secondary | ICD-10-CM | POA: Diagnosis not present

## 2021-01-19 DIAGNOSIS — Z79899 Other long term (current) drug therapy: Secondary | ICD-10-CM | POA: Diagnosis not present

## 2021-01-19 DIAGNOSIS — Z23 Encounter for immunization: Secondary | ICD-10-CM | POA: Diagnosis not present

## 2021-01-19 DIAGNOSIS — Z0000 Encounter for general adult medical examination without abnormal findings: Secondary | ICD-10-CM | POA: Diagnosis not present

## 2021-01-19 DIAGNOSIS — I10 Essential (primary) hypertension: Secondary | ICD-10-CM | POA: Diagnosis not present

## 2021-01-26 DIAGNOSIS — G4733 Obstructive sleep apnea (adult) (pediatric): Secondary | ICD-10-CM | POA: Diagnosis not present

## 2021-02-26 DIAGNOSIS — G4733 Obstructive sleep apnea (adult) (pediatric): Secondary | ICD-10-CM | POA: Diagnosis not present

## 2021-03-28 DIAGNOSIS — G4733 Obstructive sleep apnea (adult) (pediatric): Secondary | ICD-10-CM | POA: Diagnosis not present

## 2021-05-03 ENCOUNTER — Other Ambulatory Visit: Payer: Self-pay | Admitting: Geriatric Medicine

## 2021-06-23 DIAGNOSIS — H35371 Puckering of macula, right eye: Secondary | ICD-10-CM | POA: Diagnosis not present

## 2021-06-23 DIAGNOSIS — H43391 Other vitreous opacities, right eye: Secondary | ICD-10-CM | POA: Diagnosis not present

## 2021-06-23 DIAGNOSIS — H34831 Tributary (branch) retinal vein occlusion, right eye, with macular edema: Secondary | ICD-10-CM | POA: Diagnosis not present

## 2021-07-19 DIAGNOSIS — E7800 Pure hypercholesterolemia, unspecified: Secondary | ICD-10-CM | POA: Diagnosis not present

## 2021-07-19 DIAGNOSIS — I10 Essential (primary) hypertension: Secondary | ICD-10-CM | POA: Diagnosis not present

## 2021-07-19 DIAGNOSIS — Z79899 Other long term (current) drug therapy: Secondary | ICD-10-CM | POA: Diagnosis not present

## 2021-07-19 DIAGNOSIS — I700 Atherosclerosis of aorta: Secondary | ICD-10-CM | POA: Diagnosis not present

## 2021-11-11 ENCOUNTER — Other Ambulatory Visit: Payer: Self-pay | Admitting: Internal Medicine

## 2021-11-11 DIAGNOSIS — R928 Other abnormal and inconclusive findings on diagnostic imaging of breast: Secondary | ICD-10-CM

## 2021-11-12 ENCOUNTER — Other Ambulatory Visit: Payer: Self-pay | Admitting: Internal Medicine

## 2021-11-12 DIAGNOSIS — N6310 Unspecified lump in the right breast, unspecified quadrant: Secondary | ICD-10-CM

## 2022-01-06 ENCOUNTER — Ambulatory Visit
Admission: RE | Admit: 2022-01-06 | Discharge: 2022-01-06 | Disposition: A | Discharge status: Home / Self Care | Payer: Medicare Other | Source: Ambulatory Visit | Attending: Internal Medicine | Admitting: Internal Medicine

## 2022-01-06 DIAGNOSIS — R922 Inconclusive mammogram: Secondary | ICD-10-CM | POA: Diagnosis not present

## 2022-01-06 DIAGNOSIS — N6312 Unspecified lump in the right breast, upper inner quadrant: Secondary | ICD-10-CM | POA: Diagnosis not present

## 2022-01-06 DIAGNOSIS — N6310 Unspecified lump in the right breast, unspecified quadrant: Secondary | ICD-10-CM

## 2022-01-27 DIAGNOSIS — I10 Essential (primary) hypertension: Secondary | ICD-10-CM | POA: Diagnosis not present

## 2022-01-27 DIAGNOSIS — K219 Gastro-esophageal reflux disease without esophagitis: Secondary | ICD-10-CM | POA: Diagnosis not present

## 2022-01-27 DIAGNOSIS — Z780 Asymptomatic menopausal state: Secondary | ICD-10-CM | POA: Diagnosis not present

## 2022-01-27 DIAGNOSIS — E559 Vitamin D deficiency, unspecified: Secondary | ICD-10-CM | POA: Diagnosis not present

## 2022-01-27 DIAGNOSIS — M889 Osteitis deformans of unspecified bone: Secondary | ICD-10-CM | POA: Diagnosis not present

## 2022-01-27 DIAGNOSIS — I700 Atherosclerosis of aorta: Secondary | ICD-10-CM | POA: Diagnosis not present

## 2022-01-27 DIAGNOSIS — Z79899 Other long term (current) drug therapy: Secondary | ICD-10-CM | POA: Diagnosis not present

## 2022-01-27 DIAGNOSIS — E7800 Pure hypercholesterolemia, unspecified: Secondary | ICD-10-CM | POA: Diagnosis not present

## 2022-01-27 DIAGNOSIS — Z23 Encounter for immunization: Secondary | ICD-10-CM | POA: Diagnosis not present

## 2022-01-27 DIAGNOSIS — G4730 Sleep apnea, unspecified: Secondary | ICD-10-CM | POA: Diagnosis not present

## 2022-01-27 DIAGNOSIS — Z0000 Encounter for general adult medical examination without abnormal findings: Secondary | ICD-10-CM | POA: Diagnosis not present

## 2022-02-01 ENCOUNTER — Other Ambulatory Visit: Payer: Self-pay | Admitting: Internal Medicine

## 2022-02-01 DIAGNOSIS — E559 Vitamin D deficiency, unspecified: Secondary | ICD-10-CM

## 2022-02-01 DIAGNOSIS — Z780 Asymptomatic menopausal state: Secondary | ICD-10-CM

## 2022-03-09 IMAGING — MG DIGITAL DIAGNOSTIC BILAT W/ TOMO W/ CAD
8 series · 8 of 16 positions shown · non-contrast
Comparison: Previous exam(s).

CLINICAL DATA: Patient returns after screening study for evaluation
of LEFT breast calcifications and a possible RIGHT breast mass.

EXAM:
DIGITAL DIAGNOSTIC RIGHT MAMMOGRAM WITH CAD AND TOMO
ULTRASOUND RIGHT BREAST

[L ML (1 of 2)]
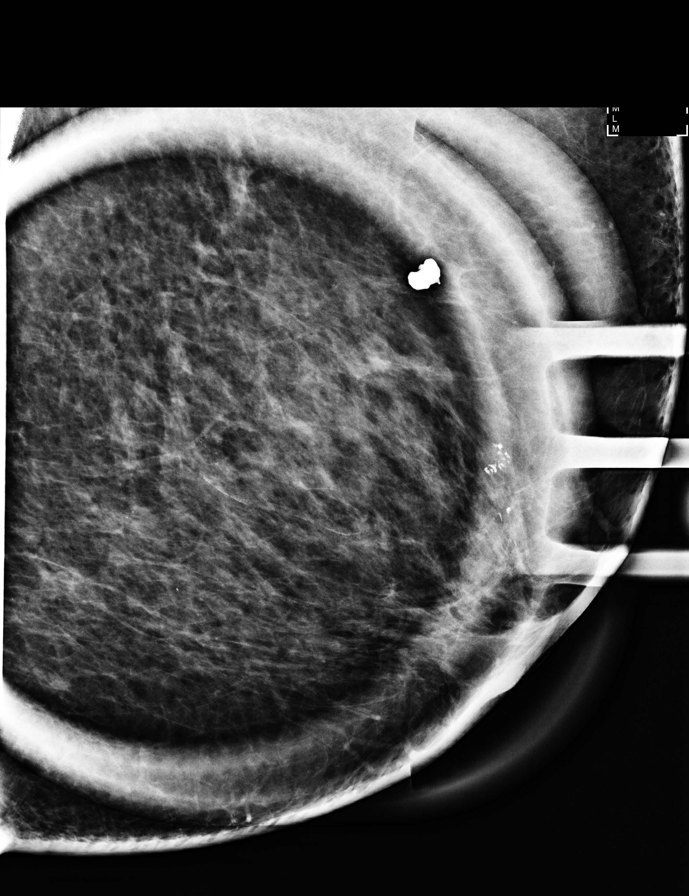

[L ML (2 of 2)]
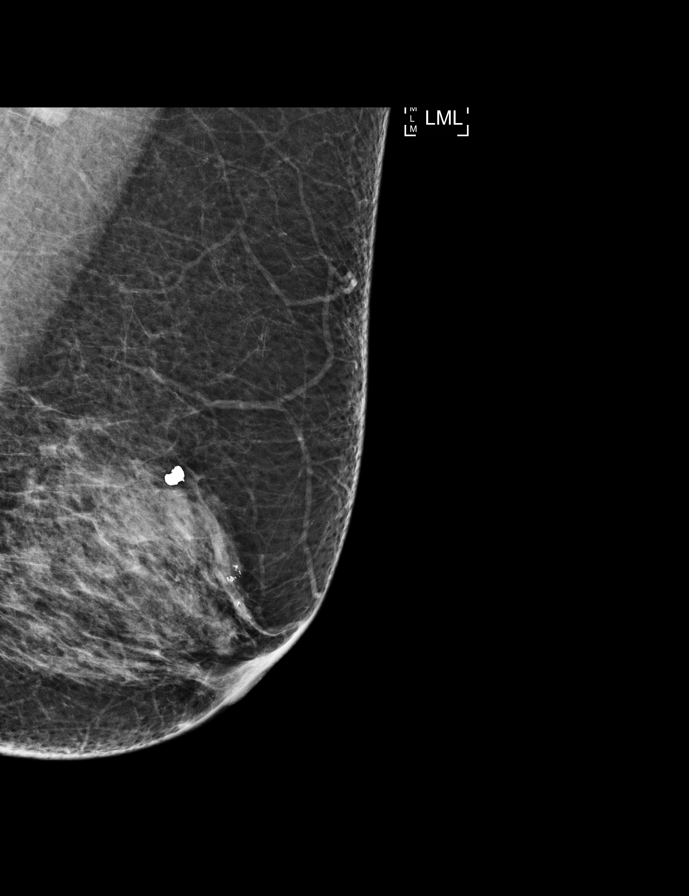

[L CC (1 of 2)]
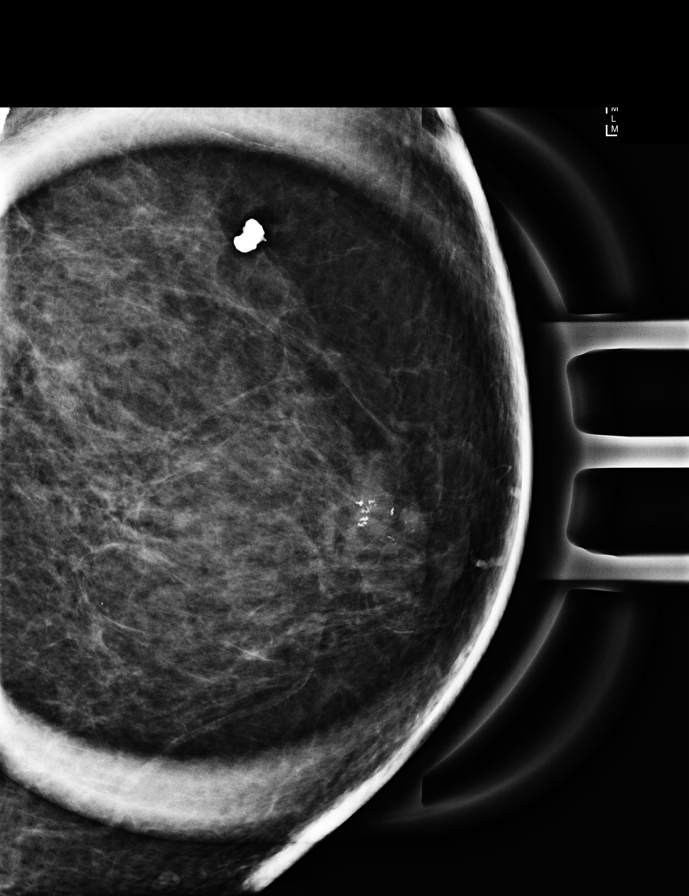

[L CC (2 of 2)]
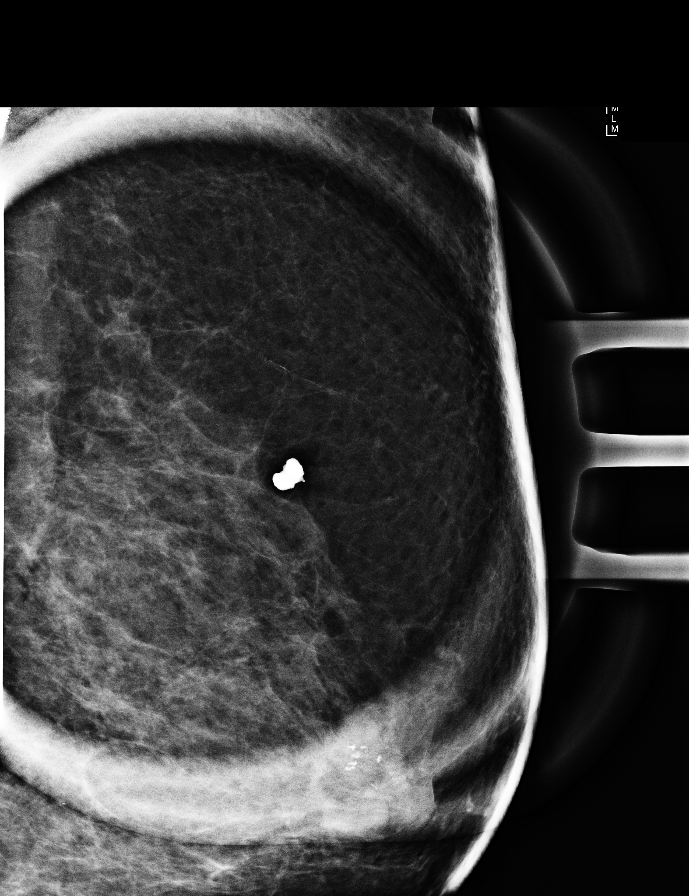

[R CC synth-2D]
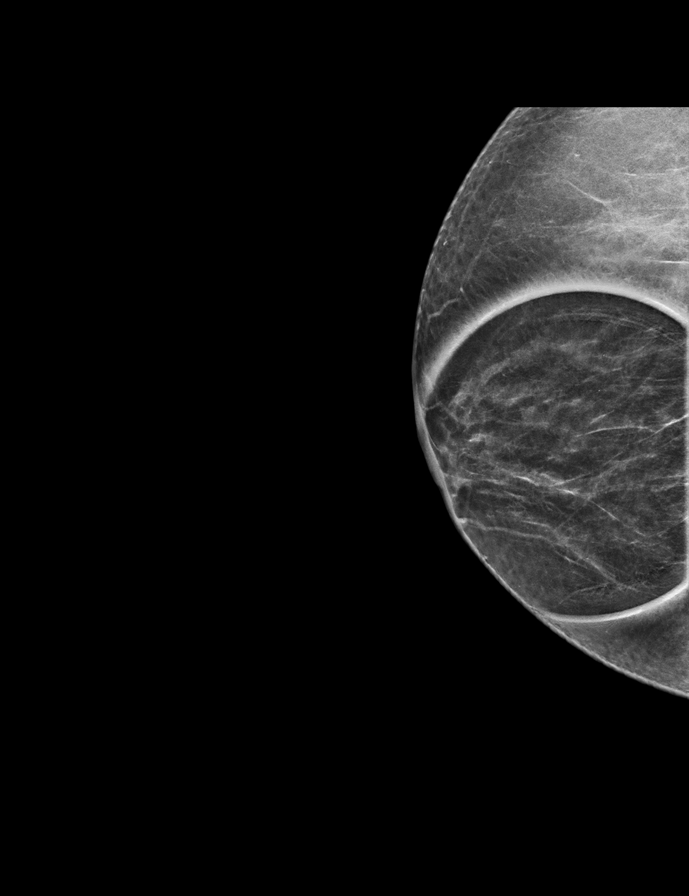

[R MLO synth-2D]
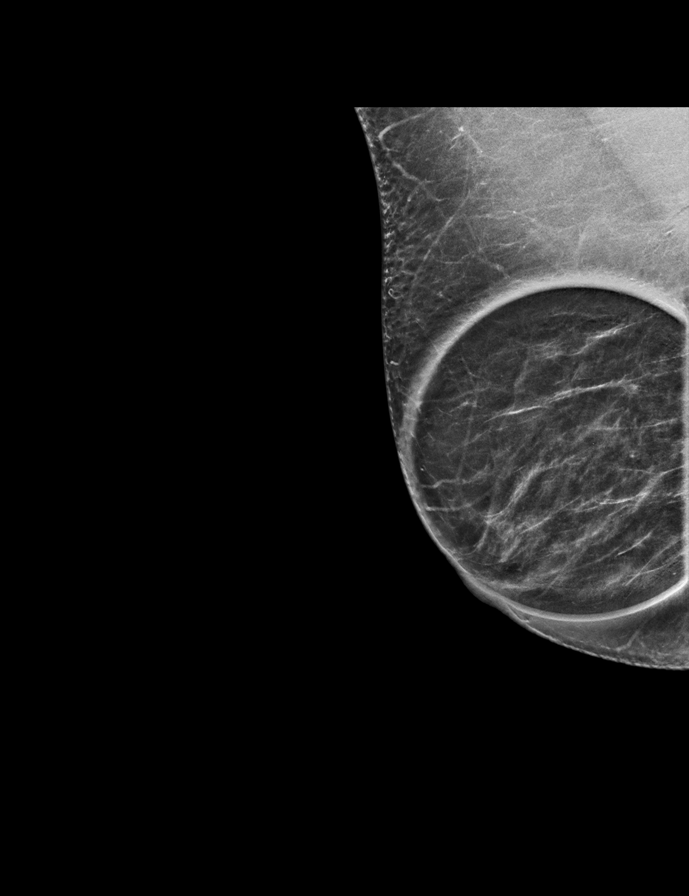

[R CC tomo · tomo slice 25/49.0]
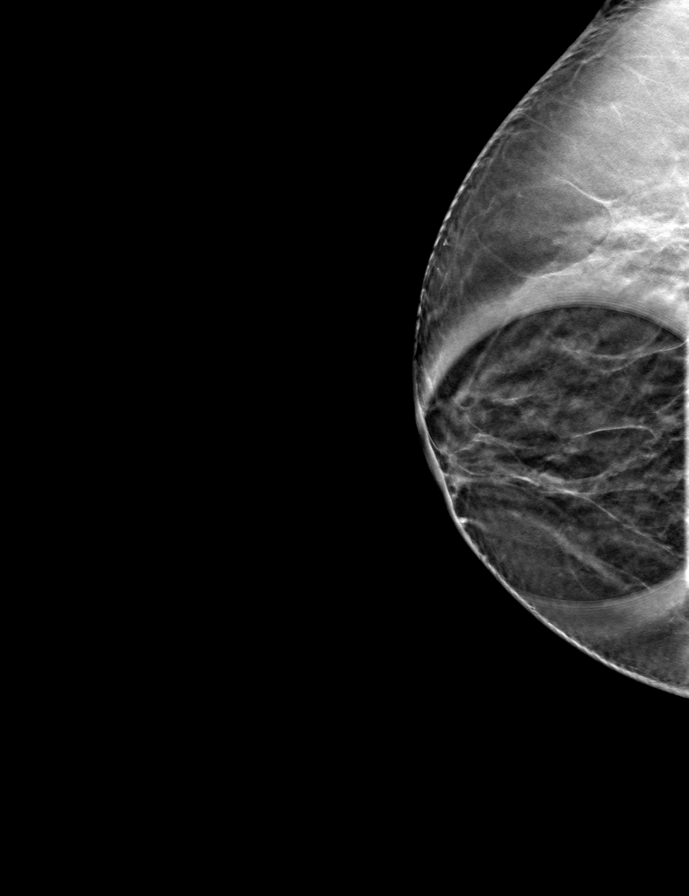

[R MLO tomo · tomo slice 28/55.0]
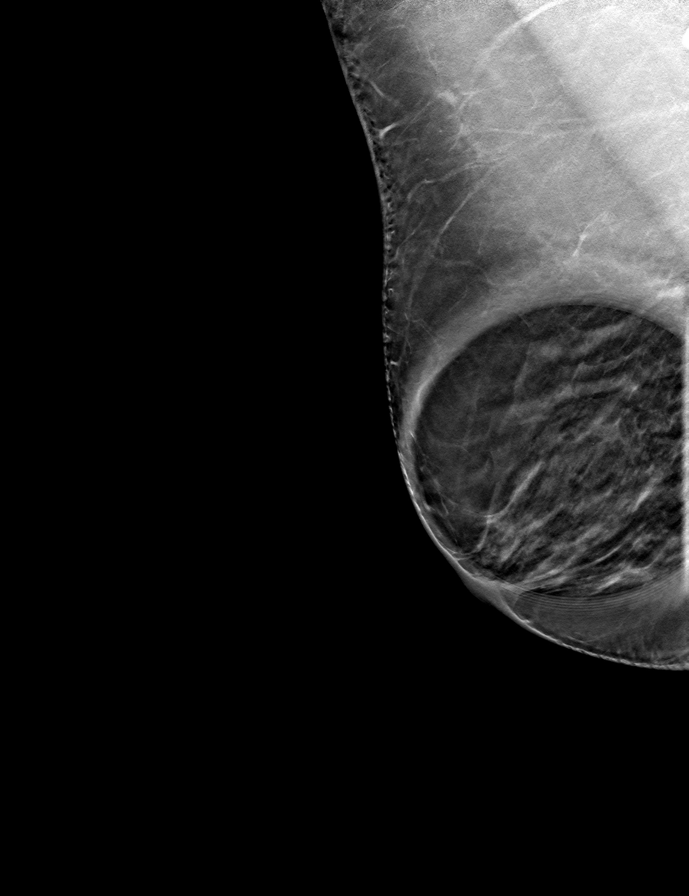

[8 of 16 positions shown; findings below may reference images not displayed]

ACR Breast Density Category c: The breast tissue is heterogeneously
dense, which may obscure small masses.
FINDINGS: RIGHT BREAST:

Mammogram: Additional 2-D and 3-D images are performed. These views
confirm presence of an oval partially obscured mass in the MEDIAL
retroareolar region of the RIGHT breast. Mammographic images were
processed with CAD.

Ultrasound: Targeted ultrasound is performed, showing an oval
hypoechoic circumscribed oval mass in the 2 o'clock location of the
RIGHT breast 1 centimeter from the nipple measuring 0.6 x 0.4 x
centimeters. There is associated posterior acoustic enhancement. No
internal vascularity by Doppler evaluation.

LEFT BREAST:

Mammogram: Magnified views are performed of calcifications in the
LOWER OUTER posterior aspect of the LEFT breast. Calcifications are
faint and punctate, similar in appearance to multiple prior exams,
accounting for differences in technique. No suspicious morphology or
distribution of calcifications identified. Mammographic images were
processed with CAD.
IMPRESSION: 1. Probably benign minimally complicated cyst in the 2 o'clock
location of the RIGHT breast warranting follow-up. We discussed
management options including excision, biopsy, and close follow-up.
Imaging followup is recommended at 6, 12, and 24 months to assess
stability. The patient concurs with this plan.
2. Benign LEFT breast calcifications.

RECOMMENDATION:
Recommend RIGHT breast ultrasound in 6 months to assess stability.

I have discussed the findings and recommendations with the patient.
If applicable, a reminder letter will be sent to the patient
regarding the next appointment.

BI-RADS CATEGORY  3: Probably benign.

## 2022-07-20 ENCOUNTER — Ambulatory Visit
Admission: RE | Admit: 2022-07-20 | Discharge: 2022-07-20 | Disposition: A | Discharge status: Home / Self Care | Payer: Medicare Other | Source: Ambulatory Visit | Attending: Internal Medicine | Admitting: Internal Medicine

## 2022-07-20 DIAGNOSIS — Z780 Asymptomatic menopausal state: Secondary | ICD-10-CM

## 2022-07-20 DIAGNOSIS — E559 Vitamin D deficiency, unspecified: Secondary | ICD-10-CM

## 2022-07-20 DIAGNOSIS — M8589 Other specified disorders of bone density and structure, multiple sites: Secondary | ICD-10-CM | POA: Diagnosis not present

## 2022-07-28 ENCOUNTER — Other Ambulatory Visit: Payer: Self-pay | Admitting: Internal Medicine

## 2022-07-28 ENCOUNTER — Ambulatory Visit
Admission: RE | Admit: 2022-07-28 | Discharge: 2022-07-28 | Disposition: A | Discharge status: Home / Self Care | Payer: Medicare Other | Source: Ambulatory Visit | Attending: Internal Medicine | Admitting: Internal Medicine

## 2022-07-28 DIAGNOSIS — M79602 Pain in left arm: Secondary | ICD-10-CM | POA: Diagnosis not present

## 2022-07-28 DIAGNOSIS — I10 Essential (primary) hypertension: Secondary | ICD-10-CM | POA: Diagnosis not present

## 2022-07-28 DIAGNOSIS — E7800 Pure hypercholesterolemia, unspecified: Secondary | ICD-10-CM

## 2022-07-28 DIAGNOSIS — K219 Gastro-esophageal reflux disease without esophagitis: Secondary | ICD-10-CM | POA: Diagnosis not present

## 2022-07-28 DIAGNOSIS — M25712 Osteophyte, left shoulder: Secondary | ICD-10-CM | POA: Diagnosis not present

## 2022-07-28 DIAGNOSIS — M25512 Pain in left shoulder: Secondary | ICD-10-CM | POA: Diagnosis not present

## 2022-07-28 DIAGNOSIS — M19012 Primary osteoarthritis, left shoulder: Secondary | ICD-10-CM | POA: Diagnosis not present

## 2022-07-28 DIAGNOSIS — M79622 Pain in left upper arm: Secondary | ICD-10-CM | POA: Diagnosis not present

## 2022-07-28 DIAGNOSIS — I700 Atherosclerosis of aorta: Secondary | ICD-10-CM | POA: Diagnosis not present

## 2022-08-25 DIAGNOSIS — M25512 Pain in left shoulder: Secondary | ICD-10-CM | POA: Diagnosis not present

## 2022-09-19 DIAGNOSIS — M25512 Pain in left shoulder: Secondary | ICD-10-CM | POA: Diagnosis not present

## 2022-10-05 DIAGNOSIS — M25512 Pain in left shoulder: Secondary | ICD-10-CM | POA: Diagnosis not present

## 2022-10-14 DIAGNOSIS — M25512 Pain in left shoulder: Secondary | ICD-10-CM | POA: Diagnosis not present

## 2022-10-28 DIAGNOSIS — M25512 Pain in left shoulder: Secondary | ICD-10-CM | POA: Diagnosis not present

## 2022-11-14 DIAGNOSIS — M25512 Pain in left shoulder: Secondary | ICD-10-CM | POA: Diagnosis not present

## 2022-12-15 DIAGNOSIS — H35341 Macular cyst, hole, or pseudohole, right eye: Secondary | ICD-10-CM | POA: Diagnosis not present

## 2022-12-29 DIAGNOSIS — H3581 Retinal edema: Secondary | ICD-10-CM | POA: Diagnosis not present

## 2023-02-01 ENCOUNTER — Other Ambulatory Visit: Payer: Self-pay | Admitting: Internal Medicine

## 2023-02-01 DIAGNOSIS — I700 Atherosclerosis of aorta: Secondary | ICD-10-CM | POA: Diagnosis not present

## 2023-02-01 DIAGNOSIS — Z0000 Encounter for general adult medical examination without abnormal findings: Secondary | ICD-10-CM | POA: Diagnosis not present

## 2023-02-01 DIAGNOSIS — M8589 Other specified disorders of bone density and structure, multiple sites: Secondary | ICD-10-CM | POA: Diagnosis not present

## 2023-02-01 DIAGNOSIS — R053 Chronic cough: Secondary | ICD-10-CM | POA: Diagnosis not present

## 2023-02-01 DIAGNOSIS — Z79899 Other long term (current) drug therapy: Secondary | ICD-10-CM | POA: Diagnosis not present

## 2023-02-01 DIAGNOSIS — E559 Vitamin D deficiency, unspecified: Secondary | ICD-10-CM | POA: Diagnosis not present

## 2023-02-01 DIAGNOSIS — E7800 Pure hypercholesterolemia, unspecified: Secondary | ICD-10-CM | POA: Diagnosis not present

## 2023-02-01 DIAGNOSIS — G4730 Sleep apnea, unspecified: Secondary | ICD-10-CM | POA: Diagnosis not present

## 2023-02-01 DIAGNOSIS — M889 Osteitis deformans of unspecified bone: Secondary | ICD-10-CM | POA: Diagnosis not present

## 2023-02-01 DIAGNOSIS — I10 Essential (primary) hypertension: Secondary | ICD-10-CM | POA: Diagnosis not present

## 2023-02-01 DIAGNOSIS — K219 Gastro-esophageal reflux disease without esophagitis: Secondary | ICD-10-CM | POA: Diagnosis not present

## 2023-02-01 DIAGNOSIS — Z23 Encounter for immunization: Secondary | ICD-10-CM | POA: Diagnosis not present

## 2023-02-01 DIAGNOSIS — Z1231 Encounter for screening mammogram for malignant neoplasm of breast: Secondary | ICD-10-CM

## 2023-02-14 ENCOUNTER — Ambulatory Visit
Admission: RE | Admit: 2023-02-14 | Discharge: 2023-02-14 | Disposition: A | Discharge status: Home / Self Care | Payer: Medicare Other | Source: Ambulatory Visit | Attending: Internal Medicine | Admitting: Internal Medicine

## 2023-02-14 DIAGNOSIS — Z1231 Encounter for screening mammogram for malignant neoplasm of breast: Secondary | ICD-10-CM | POA: Diagnosis not present

## 2023-04-27 DIAGNOSIS — H3581 Retinal edema: Secondary | ICD-10-CM | POA: Diagnosis not present

## 2023-05-17 DIAGNOSIS — H43391 Other vitreous opacities, right eye: Secondary | ICD-10-CM | POA: Diagnosis not present

## 2023-05-17 DIAGNOSIS — H43813 Vitreous degeneration, bilateral: Secondary | ICD-10-CM | POA: Diagnosis not present

## 2023-05-17 DIAGNOSIS — H35371 Puckering of macula, right eye: Secondary | ICD-10-CM | POA: Diagnosis not present

## 2023-06-12 DIAGNOSIS — H35341 Macular cyst, hole, or pseudohole, right eye: Secondary | ICD-10-CM | POA: Diagnosis not present

## 2023-06-12 DIAGNOSIS — H35371 Puckering of macula, right eye: Secondary | ICD-10-CM | POA: Diagnosis not present

## 2023-06-12 DIAGNOSIS — H3581 Retinal edema: Secondary | ICD-10-CM | POA: Diagnosis not present

## 2023-06-12 DIAGNOSIS — H33321 Round hole, right eye: Secondary | ICD-10-CM | POA: Diagnosis not present

## 2023-06-20 DIAGNOSIS — H33321 Round hole, right eye: Secondary | ICD-10-CM | POA: Diagnosis not present

## 2023-06-20 DIAGNOSIS — H35371 Puckering of macula, right eye: Secondary | ICD-10-CM | POA: Diagnosis not present

## 2023-07-12 DIAGNOSIS — H35371 Puckering of macula, right eye: Secondary | ICD-10-CM | POA: Diagnosis not present

## 2023-07-12 DIAGNOSIS — H33321 Round hole, right eye: Secondary | ICD-10-CM | POA: Diagnosis not present

## 2023-07-12 DIAGNOSIS — H3581 Retinal edema: Secondary | ICD-10-CM | POA: Diagnosis not present

## 2023-10-18 DIAGNOSIS — M6248 Contracture of muscle, other site: Secondary | ICD-10-CM | POA: Diagnosis not present

## 2023-10-25 DIAGNOSIS — M25519 Pain in unspecified shoulder: Secondary | ICD-10-CM | POA: Diagnosis not present

## 2023-10-25 DIAGNOSIS — M4802 Spinal stenosis, cervical region: Secondary | ICD-10-CM | POA: Diagnosis not present

## 2023-10-25 DIAGNOSIS — M4722 Other spondylosis with radiculopathy, cervical region: Secondary | ICD-10-CM | POA: Diagnosis not present

## 2023-10-25 DIAGNOSIS — M436 Torticollis: Secondary | ICD-10-CM | POA: Diagnosis not present

## 2023-11-02 ENCOUNTER — Other Ambulatory Visit: Payer: Self-pay | Admitting: Internal Medicine

## 2023-11-02 DIAGNOSIS — M5010 Cervical disc disorder with radiculopathy, unspecified cervical region: Secondary | ICD-10-CM

## 2023-11-10 ENCOUNTER — Encounter: Payer: Self-pay | Admitting: Internal Medicine

## 2023-11-15 ENCOUNTER — Ambulatory Visit
Admission: RE | Admit: 2023-11-15 | Discharge: 2023-11-15 | Disposition: A | Discharge status: Home / Self Care | Source: Ambulatory Visit | Attending: Internal Medicine | Admitting: Internal Medicine

## 2023-11-15 DIAGNOSIS — M4802 Spinal stenosis, cervical region: Secondary | ICD-10-CM | POA: Diagnosis not present

## 2023-11-15 DIAGNOSIS — M5010 Cervical disc disorder with radiculopathy, unspecified cervical region: Secondary | ICD-10-CM

## 2023-11-15 DIAGNOSIS — M5023 Other cervical disc displacement, cervicothoracic region: Secondary | ICD-10-CM | POA: Diagnosis not present

## 2023-11-15 DIAGNOSIS — M47812 Spondylosis without myelopathy or radiculopathy, cervical region: Secondary | ICD-10-CM | POA: Diagnosis not present

## 2023-11-22 DIAGNOSIS — M542 Cervicalgia: Secondary | ICD-10-CM | POA: Diagnosis not present

## 2023-12-06 DIAGNOSIS — H5202 Hypermetropia, left eye: Secondary | ICD-10-CM | POA: Diagnosis not present

## 2024-01-15 ENCOUNTER — Other Ambulatory Visit: Payer: Self-pay | Admitting: Internal Medicine

## 2024-01-15 DIAGNOSIS — Z1231 Encounter for screening mammogram for malignant neoplasm of breast: Secondary | ICD-10-CM

## 2024-02-16 ENCOUNTER — Ambulatory Visit
Admission: RE | Admit: 2024-02-16 | Discharge: 2024-02-16 | Disposition: A | Discharge status: Home / Self Care | Source: Ambulatory Visit | Attending: Internal Medicine | Admitting: Internal Medicine

## 2024-02-16 DIAGNOSIS — Z1231 Encounter for screening mammogram for malignant neoplasm of breast: Secondary | ICD-10-CM

## 2024-03-15 ENCOUNTER — Other Ambulatory Visit (HOSPITAL_BASED_OUTPATIENT_CLINIC_OR_DEPARTMENT_OTHER): Payer: Self-pay | Admitting: Physician Assistant

## 2024-03-15 ENCOUNTER — Ambulatory Visit (HOSPITAL_BASED_OUTPATIENT_CLINIC_OR_DEPARTMENT_OTHER)
Admission: RE | Admit: 2024-03-15 | Discharge: 2024-03-15 | Disposition: A | Source: Ambulatory Visit | Attending: Physician Assistant | Admitting: Physician Assistant

## 2024-03-15 DIAGNOSIS — R600 Localized edema: Secondary | ICD-10-CM
